# Patient Record
Sex: Male | Born: 1955
Health system: Southern US, Community
[De-identification: ages and names within clinical notes are randomized; demographics above are authoritative.]

## PROBLEM LIST (undated history)

## (undated) DIAGNOSIS — D751 Secondary polycythemia: Secondary | ICD-10-CM

## (undated) DIAGNOSIS — G4733 Obstructive sleep apnea (adult) (pediatric): Secondary | ICD-10-CM

## (undated) DIAGNOSIS — Z87442 Personal history of urinary calculi: Secondary | ICD-10-CM

## (undated) DIAGNOSIS — I639 Cerebral infarction, unspecified: Secondary | ICD-10-CM

## (undated) DIAGNOSIS — F419 Anxiety disorder, unspecified: Secondary | ICD-10-CM

## (undated) DIAGNOSIS — F329 Major depressive disorder, single episode, unspecified: Secondary | ICD-10-CM

## (undated) DIAGNOSIS — E785 Hyperlipidemia, unspecified: Secondary | ICD-10-CM

## (undated) DIAGNOSIS — F32A Depression, unspecified: Secondary | ICD-10-CM

## (undated) DIAGNOSIS — M1990 Unspecified osteoarthritis, unspecified site: Secondary | ICD-10-CM

## (undated) DIAGNOSIS — Z8719 Personal history of other diseases of the digestive system: Secondary | ICD-10-CM

## (undated) HISTORY — PX: TONSILLECTOMY AND ADENOIDECTOMY: SUR1326

## (undated) HISTORY — PX: SPLENECTOMY: SUR1306

## (undated) HISTORY — PX: COLONOSCOPY: SHX174

## (undated) HISTORY — PX: BACK SURGERY: SHX140

## (undated) HISTORY — PX: CERVICAL SPINE SURGERY: SHX589

---
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
## 1999-06-15 ENCOUNTER — Emergency Department (HOSPITAL_COMMUNITY): Admission: EM | Admit: 1999-06-15 | Discharge: 1999-06-15 | Payer: Self-pay | Admitting: Emergency Medicine

## 1999-06-15 ENCOUNTER — Encounter: Payer: Self-pay | Admitting: Emergency Medicine

## 2010-08-05 ENCOUNTER — Encounter: Payer: Self-pay | Admitting: Neurosurgery

## 2015-06-13 ENCOUNTER — Other Ambulatory Visit: Payer: Self-pay | Admitting: Nurse Practitioner

## 2015-06-13 ENCOUNTER — Ambulatory Visit
Admission: RE | Admit: 2015-06-13 | Discharge: 2015-06-13 | Disposition: A | Discharge status: Home / Self Care | Payer: BLUE CROSS/BLUE SHIELD | Source: Ambulatory Visit | Attending: Nurse Practitioner | Admitting: Nurse Practitioner

## 2015-06-13 DIAGNOSIS — J069 Acute upper respiratory infection, unspecified: Secondary | ICD-10-CM

## 2017-07-25 ENCOUNTER — Other Ambulatory Visit: Payer: Self-pay | Admitting: Family Medicine

## 2017-07-25 DIAGNOSIS — R063 Periodic breathing: Secondary | ICD-10-CM

## 2017-08-01 ENCOUNTER — Ambulatory Visit (HOSPITAL_COMMUNITY): Payer: BLUE CROSS/BLUE SHIELD | Attending: Cardiology

## 2017-08-01 ENCOUNTER — Other Ambulatory Visit: Payer: Self-pay

## 2017-08-01 DIAGNOSIS — R063 Periodic breathing: Secondary | ICD-10-CM

## 2017-08-21 DIAGNOSIS — J209 Acute bronchitis, unspecified: Secondary | ICD-10-CM | POA: Diagnosis not present

## 2017-08-29 DIAGNOSIS — R0902 Hypoxemia: Secondary | ICD-10-CM | POA: Diagnosis not present

## 2017-09-02 ENCOUNTER — Other Ambulatory Visit (HOSPITAL_BASED_OUTPATIENT_CLINIC_OR_DEPARTMENT_OTHER): Payer: Self-pay

## 2017-09-02 DIAGNOSIS — R0902 Hypoxemia: Secondary | ICD-10-CM

## 2017-09-02 DIAGNOSIS — G4733 Obstructive sleep apnea (adult) (pediatric): Secondary | ICD-10-CM

## 2017-09-02 DIAGNOSIS — R063 Periodic breathing: Secondary | ICD-10-CM

## 2017-09-10 ENCOUNTER — Encounter (HOSPITAL_BASED_OUTPATIENT_CLINIC_OR_DEPARTMENT_OTHER): Payer: BLUE CROSS/BLUE SHIELD

## 2017-09-11 DIAGNOSIS — G4733 Obstructive sleep apnea (adult) (pediatric): Secondary | ICD-10-CM | POA: Diagnosis not present

## 2017-09-28 ENCOUNTER — Encounter (HOSPITAL_BASED_OUTPATIENT_CLINIC_OR_DEPARTMENT_OTHER): Payer: BLUE CROSS/BLUE SHIELD

## 2017-10-05 ENCOUNTER — Ambulatory Visit (HOSPITAL_BASED_OUTPATIENT_CLINIC_OR_DEPARTMENT_OTHER): Payer: BLUE CROSS/BLUE SHIELD | Attending: Internal Medicine | Admitting: Internal Medicine

## 2017-10-05 VITALS — Ht 70.0 in | Wt 270.0 lb

## 2017-10-05 DIAGNOSIS — R0902 Hypoxemia: Secondary | ICD-10-CM | POA: Insufficient documentation

## 2017-10-05 DIAGNOSIS — G4733 Obstructive sleep apnea (adult) (pediatric): Secondary | ICD-10-CM | POA: Diagnosis not present

## 2017-10-05 DIAGNOSIS — R063 Periodic breathing: Secondary | ICD-10-CM

## 2017-10-11 DIAGNOSIS — G4733 Obstructive sleep apnea (adult) (pediatric): Secondary | ICD-10-CM | POA: Diagnosis not present

## 2017-10-11 NOTE — Procedures (Signed)
   NAME: Paul Hinton DATE OF BIRTH:  08-23-1955 MEDICAL RECORD NUMBER [id number]  LOCATION: [facility]  PHYSICIAN: Deretha EmoryJames C Osborne  DATE OF STUDY: 10/05/2017  SLEEP STUDY TYPE: Positive Airway Pressure Titration               REFERRING PHYSICIAN: Deretha Emorysborne, James C, MD  INDICATION FOR STUDY: Inadequate control of oxygenation no auto BPAP.   EPWORTH SLEEPINESS SCORE:  NA HEIGHT: 5\' 10"  (177.8 cm)  WEIGHT: 270 lb (122.5 kg)    Body mass index is 38.74 kg/m.  NECK SIZE: 20 in.  MEDICATIONS  Patient self administered medications include: N/A. Medications administered during study include No sleep medicine administered.Marland Kitchen.   SLEEP STUDY TECHNIQUE  The patient underwent an attended overnight polysomnography titration to assess the effects of cpap therapy. The following variables were monitored: EEG (C4-A1, C3-A2, O1-A2, O2-A1, F3-M2, F4-M1), EOG, submental and leg EMG, ECG, oxyhemoglobin saturation by pulse oximetry, thoracic and abdominal respiratory effort belts, nasal/oral airflow by pressure sensor, body position sensor and snoring sensor. CPAP pressure was titrated to eliminate apneas, hypopneas and oxygen desaturation.   TECHNICAL COMMENTS  Comments added by Technician: NONE Comments added by Scorer: N/A   SLEEP ARCHITECTURE  The study was initiated at 9:41:29 PM and terminated at 5:28:44 AM. Total recorded time was 467.2 minutes. EEG confirmed total sleep time was 398.5 minutes yielding a sleep efficiency of 85.3%%. Sleep onset after lights out was 10.1 minutes with a REM latency of 82.5 minutes. The patient spent 17.3%% of the night in stage N1 sleep, 55.0%% in stage N2 sleep, 0.0%% in stage N3 and 27.73% in REM. The Arousal Index was 28.9/hour.   RESPIRATORY PARAMETERS  The overall AHI was 25.9 per hour, and the RDI was 27.4 events/hour with a central apnea index of 6.9per hour. The most appropriate setting of BiPAP was IPAP/EPAP 22/19 cm H2O. This pressure was  required to control supine OSA; non-supine OSA was controlled at lower pressures. At this setting, the sleep efficiency was 78 % and the patient was supine for 5%. The AHI was 3.1 events per hour, and the RDI was 3.1 events/hour (with 6.9 central events) and the arousal index was 7.7 per hour.The oxygen nadir was 92.0% during sleep. Mouth opening causing leak was noted despite use of a FFM as his mouth would come below the bottom of the mask.   LEG MOVEMENT DATA  The total leg movements were 0 with a resulting leg movement index of 0.0. Associated arousal with leg movement index was 0.0.   CARDIAC DATA  The underlying cardiac rhythm was most consistent with sinus rhythm. Mean heart rate during sleep was 68.2 bpm. Additional rhythm abnormalities include None.   IMPRESSIONS  - Adequate BIPAP titration. The pressure needed is higher than the pressures he has been using at home. Severe leak had also been noted. Chinstrap may prove useful.   DIAGNOSIS  - Obstructive Sleep Apnea (327.23 [G47.33 ICD-10])  RECOMMENDATIONS  - Auto BPAP with Max IPAP 23, Min EPAP 3, PS of 5  - Chinstrap should be added  Deretha EmoryJames C Osborne Sleep specialist, American Board of Internal Medicine  ELECTRONICALLY SIGNED ON:  10/11/2017, 9:20 PM [facility] PH: [phone]   FX: [phone] ACCREDITED BY THE AMERICAN ACADEMY OF SLEEP MEDICINE

## 2017-10-12 DIAGNOSIS — G4733 Obstructive sleep apnea (adult) (pediatric): Secondary | ICD-10-CM | POA: Diagnosis not present

## 2017-11-11 DIAGNOSIS — G4733 Obstructive sleep apnea (adult) (pediatric): Secondary | ICD-10-CM | POA: Diagnosis not present

## 2017-12-12 DIAGNOSIS — G4733 Obstructive sleep apnea (adult) (pediatric): Secondary | ICD-10-CM | POA: Diagnosis not present

## 2017-12-17 DIAGNOSIS — G4733 Obstructive sleep apnea (adult) (pediatric): Secondary | ICD-10-CM | POA: Diagnosis not present

## 2017-12-17 DIAGNOSIS — R718 Other abnormality of red blood cells: Secondary | ICD-10-CM | POA: Diagnosis not present

## 2018-01-11 DIAGNOSIS — G4733 Obstructive sleep apnea (adult) (pediatric): Secondary | ICD-10-CM | POA: Diagnosis not present

## 2018-01-16 DIAGNOSIS — Z125 Encounter for screening for malignant neoplasm of prostate: Secondary | ICD-10-CM | POA: Diagnosis not present

## 2018-01-16 DIAGNOSIS — E291 Testicular hypofunction: Secondary | ICD-10-CM | POA: Diagnosis not present

## 2018-01-16 DIAGNOSIS — N5201 Erectile dysfunction due to arterial insufficiency: Secondary | ICD-10-CM | POA: Diagnosis not present

## 2018-01-16 DIAGNOSIS — N509 Disorder of male genital organs, unspecified: Secondary | ICD-10-CM | POA: Diagnosis not present

## 2018-02-11 DIAGNOSIS — G4733 Obstructive sleep apnea (adult) (pediatric): Secondary | ICD-10-CM | POA: Diagnosis not present

## 2018-03-14 DIAGNOSIS — G4733 Obstructive sleep apnea (adult) (pediatric): Secondary | ICD-10-CM | POA: Diagnosis not present

## 2018-04-13 DIAGNOSIS — G4733 Obstructive sleep apnea (adult) (pediatric): Secondary | ICD-10-CM | POA: Diagnosis not present

## 2018-05-14 DIAGNOSIS — G4733 Obstructive sleep apnea (adult) (pediatric): Secondary | ICD-10-CM | POA: Diagnosis not present

## 2018-06-13 DIAGNOSIS — G4733 Obstructive sleep apnea (adult) (pediatric): Secondary | ICD-10-CM | POA: Diagnosis not present

## 2018-07-28 DIAGNOSIS — N390 Urinary tract infection, site not specified: Secondary | ICD-10-CM | POA: Diagnosis not present

## 2018-07-28 DIAGNOSIS — B9620 Unspecified Escherichia coli [E. coli] as the cause of diseases classified elsewhere: Secondary | ICD-10-CM | POA: Diagnosis not present

## 2018-08-17 DIAGNOSIS — Z0000 Encounter for general adult medical examination without abnormal findings: Secondary | ICD-10-CM | POA: Diagnosis not present

## 2018-08-17 DIAGNOSIS — R718 Other abnormality of red blood cells: Secondary | ICD-10-CM | POA: Diagnosis not present

## 2018-08-17 DIAGNOSIS — R7301 Impaired fasting glucose: Secondary | ICD-10-CM | POA: Diagnosis not present

## 2018-08-17 DIAGNOSIS — E7800 Pure hypercholesterolemia, unspecified: Secondary | ICD-10-CM | POA: Diagnosis not present

## 2018-08-17 DIAGNOSIS — R063 Periodic breathing: Secondary | ICD-10-CM | POA: Diagnosis not present

## 2018-09-03 ENCOUNTER — Other Ambulatory Visit: Payer: Self-pay | Admitting: Internal Medicine

## 2018-09-03 DIAGNOSIS — M79604 Pain in right leg: Secondary | ICD-10-CM

## 2018-09-17 ENCOUNTER — Ambulatory Visit
Admission: RE | Admit: 2018-09-17 | Discharge: 2018-09-17 | Disposition: A | Discharge status: Home / Self Care | Payer: BLUE CROSS/BLUE SHIELD | Source: Ambulatory Visit | Attending: Internal Medicine | Admitting: Internal Medicine

## 2018-09-17 DIAGNOSIS — M4802 Spinal stenosis, cervical region: Secondary | ICD-10-CM | POA: Diagnosis not present

## 2018-09-17 DIAGNOSIS — M79604 Pain in right leg: Secondary | ICD-10-CM

## 2018-09-17 DIAGNOSIS — M48061 Spinal stenosis, lumbar region without neurogenic claudication: Secondary | ICD-10-CM | POA: Diagnosis not present

## 2018-10-21 DIAGNOSIS — M4802 Spinal stenosis, cervical region: Secondary | ICD-10-CM | POA: Diagnosis not present

## 2018-10-21 DIAGNOSIS — M4712 Other spondylosis with myelopathy, cervical region: Secondary | ICD-10-CM | POA: Diagnosis not present

## 2018-10-23 DIAGNOSIS — M79602 Pain in left arm: Secondary | ICD-10-CM | POA: Diagnosis not present

## 2018-10-23 DIAGNOSIS — M4712 Other spondylosis with myelopathy, cervical region: Secondary | ICD-10-CM | POA: Diagnosis not present

## 2018-10-23 DIAGNOSIS — M4802 Spinal stenosis, cervical region: Secondary | ICD-10-CM | POA: Diagnosis not present

## 2018-10-23 DIAGNOSIS — M79604 Pain in right leg: Secondary | ICD-10-CM | POA: Diagnosis not present

## 2018-10-29 DIAGNOSIS — Z9081 Acquired absence of spleen: Secondary | ICD-10-CM | POA: Diagnosis not present

## 2018-10-29 DIAGNOSIS — Z981 Arthrodesis status: Secondary | ICD-10-CM | POA: Diagnosis not present

## 2018-10-29 DIAGNOSIS — I498 Other specified cardiac arrhythmias: Secondary | ICD-10-CM | POA: Diagnosis not present

## 2018-10-29 DIAGNOSIS — G4733 Obstructive sleep apnea (adult) (pediatric): Secondary | ICD-10-CM | POA: Diagnosis not present

## 2018-10-29 DIAGNOSIS — Z9989 Dependence on other enabling machines and devices: Secondary | ICD-10-CM | POA: Diagnosis not present

## 2018-10-29 DIAGNOSIS — M4712 Other spondylosis with myelopathy, cervical region: Secondary | ICD-10-CM | POA: Diagnosis not present

## 2018-10-29 DIAGNOSIS — M4802 Spinal stenosis, cervical region: Secondary | ICD-10-CM | POA: Diagnosis not present

## 2018-10-29 DIAGNOSIS — M4722 Other spondylosis with radiculopathy, cervical region: Secondary | ICD-10-CM | POA: Diagnosis not present

## 2018-10-30 DIAGNOSIS — Z9989 Dependence on other enabling machines and devices: Secondary | ICD-10-CM | POA: Diagnosis not present

## 2018-10-30 DIAGNOSIS — Z9081 Acquired absence of spleen: Secondary | ICD-10-CM | POA: Diagnosis not present

## 2018-10-30 DIAGNOSIS — M4802 Spinal stenosis, cervical region: Secondary | ICD-10-CM | POA: Diagnosis not present

## 2018-10-30 DIAGNOSIS — G4733 Obstructive sleep apnea (adult) (pediatric): Secondary | ICD-10-CM | POA: Diagnosis not present

## 2018-10-30 DIAGNOSIS — M4712 Other spondylosis with myelopathy, cervical region: Secondary | ICD-10-CM | POA: Diagnosis not present

## 2018-12-14 DIAGNOSIS — N451 Epididymitis: Secondary | ICD-10-CM | POA: Diagnosis not present

## 2018-12-16 DIAGNOSIS — D751 Secondary polycythemia: Secondary | ICD-10-CM | POA: Diagnosis not present

## 2019-02-16 DIAGNOSIS — Z125 Encounter for screening for malignant neoplasm of prostate: Secondary | ICD-10-CM | POA: Diagnosis not present

## 2019-02-16 DIAGNOSIS — E291 Testicular hypofunction: Secondary | ICD-10-CM | POA: Diagnosis not present

## 2019-02-22 DIAGNOSIS — G4733 Obstructive sleep apnea (adult) (pediatric): Secondary | ICD-10-CM | POA: Diagnosis not present

## 2019-02-26 DIAGNOSIS — E291 Testicular hypofunction: Secondary | ICD-10-CM | POA: Diagnosis not present

## 2019-02-26 DIAGNOSIS — N451 Epididymitis: Secondary | ICD-10-CM | POA: Diagnosis not present

## 2019-02-26 DIAGNOSIS — N509 Disorder of male genital organs, unspecified: Secondary | ICD-10-CM | POA: Diagnosis not present

## 2019-02-26 DIAGNOSIS — N5201 Erectile dysfunction due to arterial insufficiency: Secondary | ICD-10-CM | POA: Diagnosis not present

## 2019-03-12 DIAGNOSIS — M542 Cervicalgia: Secondary | ICD-10-CM | POA: Diagnosis not present

## 2019-03-12 DIAGNOSIS — R200 Anesthesia of skin: Secondary | ICD-10-CM | POA: Diagnosis not present

## 2019-03-12 DIAGNOSIS — M79604 Pain in right leg: Secondary | ICD-10-CM | POA: Diagnosis not present

## 2019-03-12 DIAGNOSIS — M79602 Pain in left arm: Secondary | ICD-10-CM | POA: Diagnosis not present

## 2019-04-08 DIAGNOSIS — M4722 Other spondylosis with radiculopathy, cervical region: Secondary | ICD-10-CM | POA: Diagnosis not present

## 2019-04-08 DIAGNOSIS — M5431 Sciatica, right side: Secondary | ICD-10-CM | POA: Diagnosis not present

## 2019-04-08 DIAGNOSIS — M48062 Spinal stenosis, lumbar region with neurogenic claudication: Secondary | ICD-10-CM | POA: Diagnosis not present

## 2019-04-08 DIAGNOSIS — M5136 Other intervertebral disc degeneration, lumbar region: Secondary | ICD-10-CM | POA: Diagnosis not present

## 2019-04-08 DIAGNOSIS — M2578 Osteophyte, vertebrae: Secondary | ICD-10-CM | POA: Diagnosis not present

## 2019-04-08 DIAGNOSIS — R200 Anesthesia of skin: Secondary | ICD-10-CM | POA: Diagnosis not present

## 2019-04-08 DIAGNOSIS — M898X8 Other specified disorders of bone, other site: Secondary | ICD-10-CM | POA: Diagnosis not present

## 2019-04-08 DIAGNOSIS — Z981 Arthrodesis status: Secondary | ICD-10-CM | POA: Diagnosis not present

## 2019-04-08 DIAGNOSIS — M47812 Spondylosis without myelopathy or radiculopathy, cervical region: Secondary | ICD-10-CM | POA: Diagnosis not present

## 2019-04-08 DIAGNOSIS — M4316 Spondylolisthesis, lumbar region: Secondary | ICD-10-CM | POA: Diagnosis not present

## 2019-04-08 DIAGNOSIS — M4712 Other spondylosis with myelopathy, cervical region: Secondary | ICD-10-CM | POA: Diagnosis not present

## 2019-04-08 DIAGNOSIS — M5031 Other cervical disc degeneration,  high cervical region: Secondary | ICD-10-CM | POA: Diagnosis not present

## 2019-04-08 DIAGNOSIS — M4802 Spinal stenosis, cervical region: Secondary | ICD-10-CM | POA: Diagnosis not present

## 2019-04-08 DIAGNOSIS — Z09 Encounter for follow-up examination after completed treatment for conditions other than malignant neoplasm: Secondary | ICD-10-CM | POA: Diagnosis not present

## 2019-04-09 DIAGNOSIS — E291 Testicular hypofunction: Secondary | ICD-10-CM | POA: Diagnosis not present

## 2019-04-13 DIAGNOSIS — M5416 Radiculopathy, lumbar region: Secondary | ICD-10-CM | POA: Diagnosis not present

## 2019-04-13 DIAGNOSIS — M5412 Radiculopathy, cervical region: Secondary | ICD-10-CM | POA: Diagnosis not present

## 2019-04-13 DIAGNOSIS — F320 Major depressive disorder, single episode, mild: Secondary | ICD-10-CM | POA: Diagnosis not present

## 2019-04-19 DIAGNOSIS — M5416 Radiculopathy, lumbar region: Secondary | ICD-10-CM | POA: Diagnosis not present

## 2019-04-19 DIAGNOSIS — M48062 Spinal stenosis, lumbar region with neurogenic claudication: Secondary | ICD-10-CM | POA: Diagnosis not present

## 2019-05-04 DIAGNOSIS — M5416 Radiculopathy, lumbar region: Secondary | ICD-10-CM | POA: Diagnosis not present

## 2019-05-19 DIAGNOSIS — M48062 Spinal stenosis, lumbar region with neurogenic claudication: Secondary | ICD-10-CM | POA: Diagnosis not present

## 2019-05-19 DIAGNOSIS — M5416 Radiculopathy, lumbar region: Secondary | ICD-10-CM | POA: Diagnosis not present

## 2019-05-19 DIAGNOSIS — M5090 Cervical disc disorder, unspecified, unspecified cervical region: Secondary | ICD-10-CM | POA: Diagnosis not present

## 2019-05-20 DIAGNOSIS — M5030 Other cervical disc degeneration, unspecified cervical region: Secondary | ICD-10-CM | POA: Diagnosis not present

## 2019-05-20 DIAGNOSIS — M4726 Other spondylosis with radiculopathy, lumbar region: Secondary | ICD-10-CM | POA: Diagnosis not present

## 2019-06-01 DIAGNOSIS — M545 Low back pain: Secondary | ICD-10-CM | POA: Diagnosis not present

## 2019-06-01 DIAGNOSIS — M47816 Spondylosis without myelopathy or radiculopathy, lumbar region: Secondary | ICD-10-CM | POA: Diagnosis not present

## 2019-06-23 DIAGNOSIS — N5201 Erectile dysfunction due to arterial insufficiency: Secondary | ICD-10-CM | POA: Diagnosis not present

## 2019-06-23 DIAGNOSIS — N201 Calculus of ureter: Secondary | ICD-10-CM | POA: Diagnosis not present

## 2019-06-23 DIAGNOSIS — E291 Testicular hypofunction: Secondary | ICD-10-CM | POA: Diagnosis not present

## 2019-06-24 DIAGNOSIS — M48062 Spinal stenosis, lumbar region with neurogenic claudication: Secondary | ICD-10-CM | POA: Diagnosis not present

## 2019-06-24 DIAGNOSIS — M5090 Cervical disc disorder, unspecified, unspecified cervical region: Secondary | ICD-10-CM | POA: Diagnosis not present

## 2019-06-24 DIAGNOSIS — M5416 Radiculopathy, lumbar region: Secondary | ICD-10-CM | POA: Diagnosis not present

## 2019-06-24 DIAGNOSIS — R200 Anesthesia of skin: Secondary | ICD-10-CM | POA: Diagnosis not present

## 2019-06-24 DIAGNOSIS — R202 Paresthesia of skin: Secondary | ICD-10-CM | POA: Diagnosis not present

## 2019-07-01 DIAGNOSIS — M48062 Spinal stenosis, lumbar region with neurogenic claudication: Secondary | ICD-10-CM | POA: Diagnosis not present

## 2019-07-01 DIAGNOSIS — M5090 Cervical disc disorder, unspecified, unspecified cervical region: Secondary | ICD-10-CM | POA: Diagnosis not present

## 2019-07-01 DIAGNOSIS — R200 Anesthesia of skin: Secondary | ICD-10-CM | POA: Diagnosis not present

## 2019-07-01 DIAGNOSIS — M5416 Radiculopathy, lumbar region: Secondary | ICD-10-CM | POA: Diagnosis not present

## 2019-07-01 DIAGNOSIS — R202 Paresthesia of skin: Secondary | ICD-10-CM | POA: Diagnosis not present

## 2019-07-05 DIAGNOSIS — M48062 Spinal stenosis, lumbar region with neurogenic claudication: Secondary | ICD-10-CM | POA: Diagnosis not present

## 2019-07-05 DIAGNOSIS — R202 Paresthesia of skin: Secondary | ICD-10-CM | POA: Diagnosis not present

## 2019-07-05 DIAGNOSIS — M5416 Radiculopathy, lumbar region: Secondary | ICD-10-CM | POA: Diagnosis not present

## 2019-07-05 DIAGNOSIS — M5090 Cervical disc disorder, unspecified, unspecified cervical region: Secondary | ICD-10-CM | POA: Diagnosis not present

## 2019-07-05 DIAGNOSIS — R200 Anesthesia of skin: Secondary | ICD-10-CM | POA: Diagnosis not present

## 2019-07-13 DIAGNOSIS — R200 Anesthesia of skin: Secondary | ICD-10-CM | POA: Diagnosis not present

## 2019-07-13 DIAGNOSIS — M5416 Radiculopathy, lumbar region: Secondary | ICD-10-CM | POA: Diagnosis not present

## 2019-07-13 DIAGNOSIS — R202 Paresthesia of skin: Secondary | ICD-10-CM | POA: Diagnosis not present

## 2019-07-13 DIAGNOSIS — M5090 Cervical disc disorder, unspecified, unspecified cervical region: Secondary | ICD-10-CM | POA: Diagnosis not present

## 2019-07-13 DIAGNOSIS — M48062 Spinal stenosis, lumbar region with neurogenic claudication: Secondary | ICD-10-CM | POA: Diagnosis not present

## 2019-07-21 IMAGING — MR MR LUMBAR SPINE W/O CM
4 of 5 series · 24 of 48 positions shown · non-contrast
Comparison: CT abdomen and pelvis July 02, 2015

CLINICAL DATA: Low back pain, burning sensation RIGHT leg for 5
months. Remote history of lumbar spine surgery.

EXAM:
MRI LUMBAR SPINE WITHOUT CONTRAST
TECHNIQUE: Multiplanar, multisequence MR imaging of the lumbar spine was
performed. No intravenous contrast was administered.

[Series 3: T2 post-contrast · sagittal · 4.0mm · 0.55mm/px · 7 of 15 slices shown]
[im 1/15]
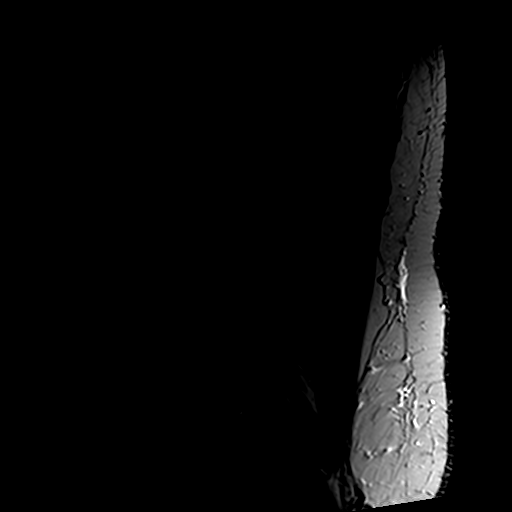
[im 3/15]
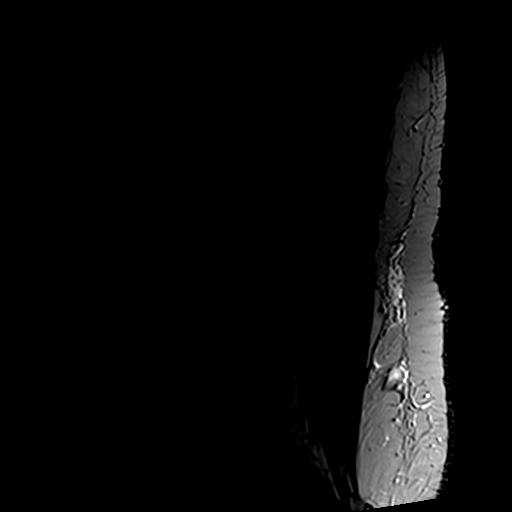
[im 5/15]
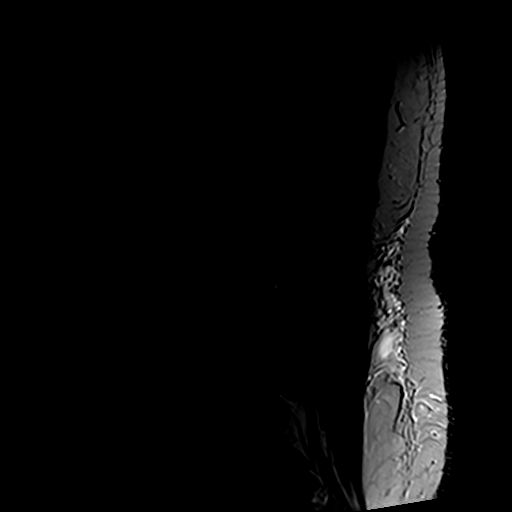
[im 8/15]
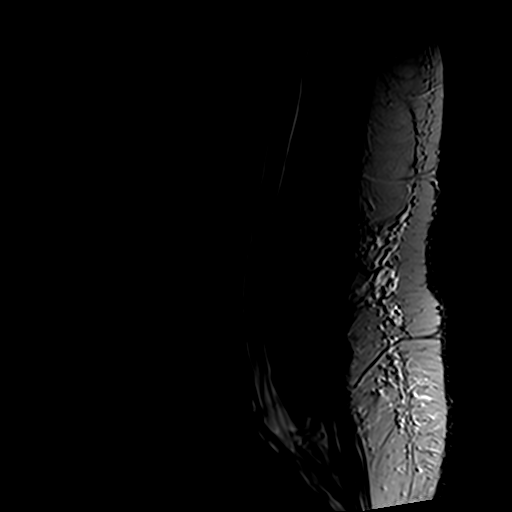
[im 10/15]
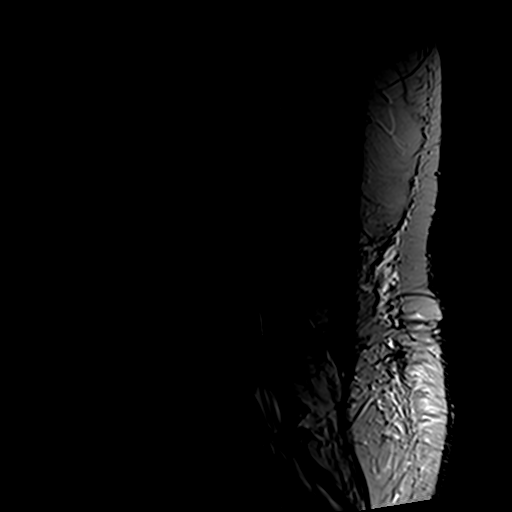
[im 12/15]
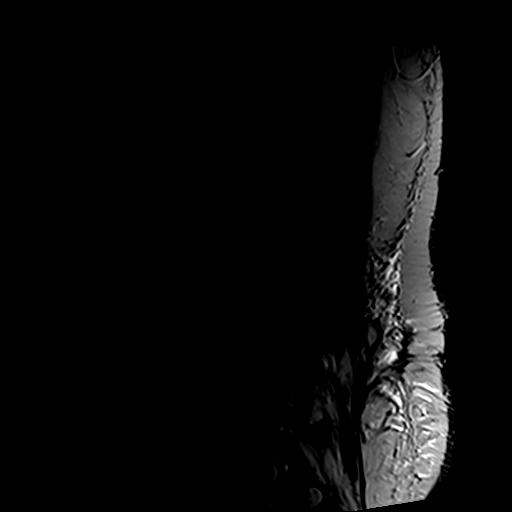
[im 15/15]
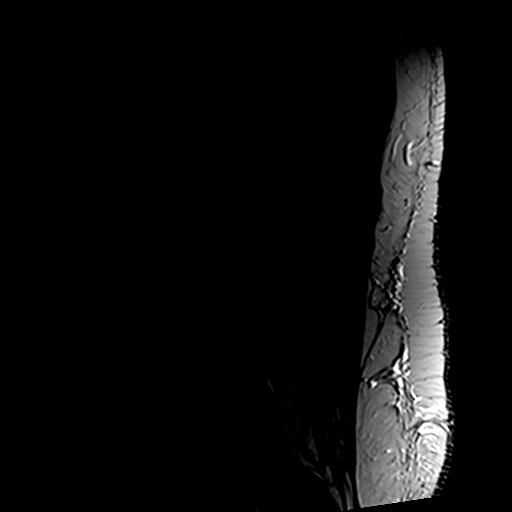

[Series 5: T1 · sagittal · 4.0mm · 0.55mm/px · 6 of 15 slices shown (1 of 2)]
[im 1/15]
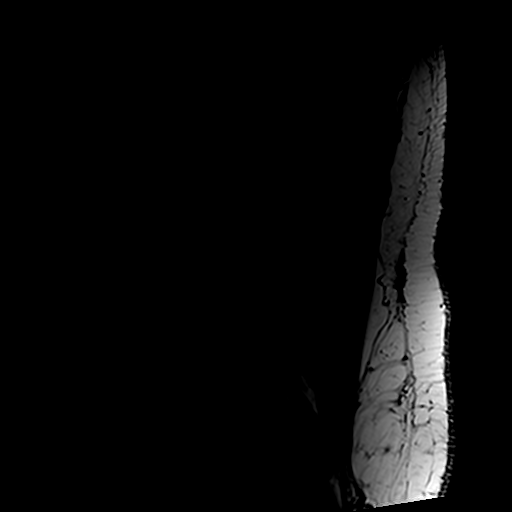
[im 3/15]
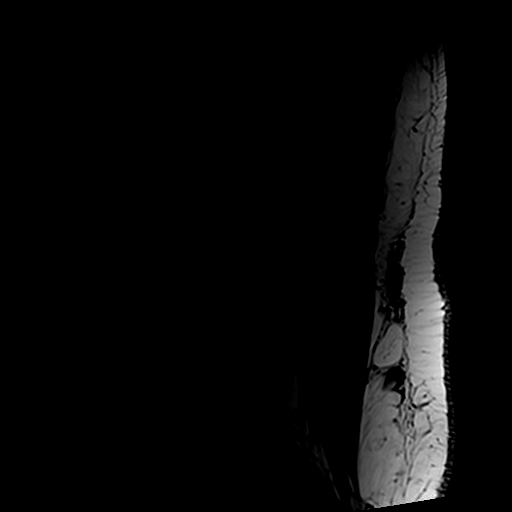
[im 6/15]
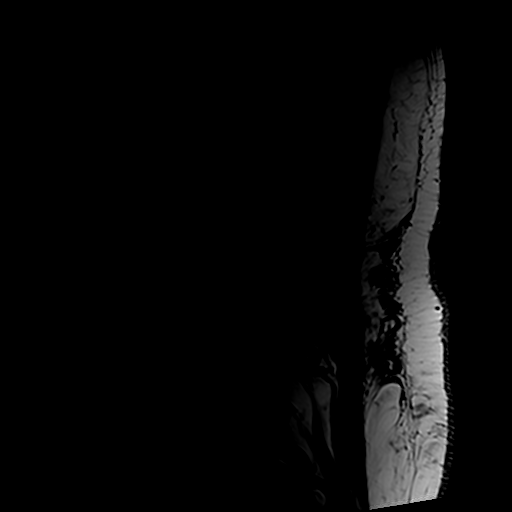
[im 9/15]
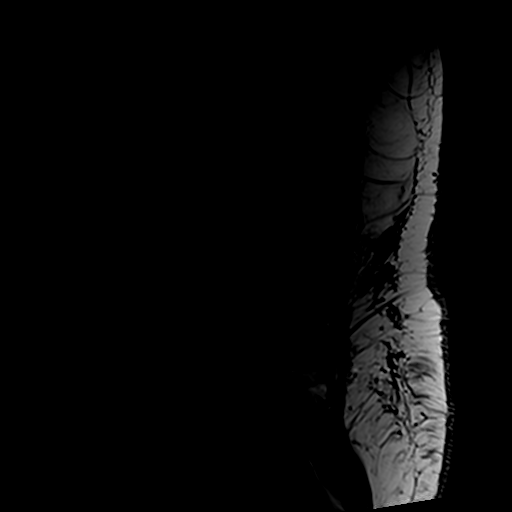
[im 12/15]
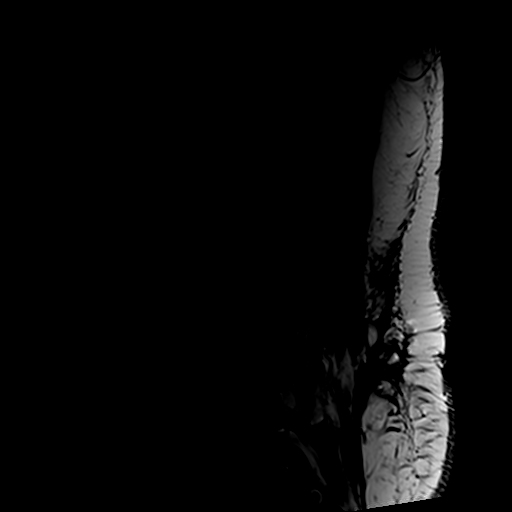
[im 15/15]
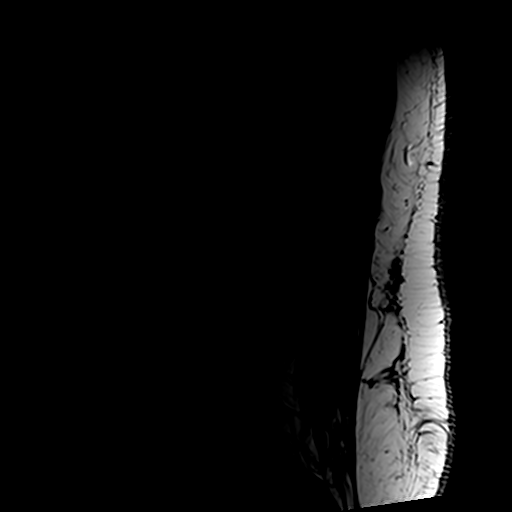

[Series 6: T1 · axial · 4.0mm · 0.35mm/px · z∈[-65,+84]mm · 3 of 33 slices shown (2 of 2)]
[im 5/33]
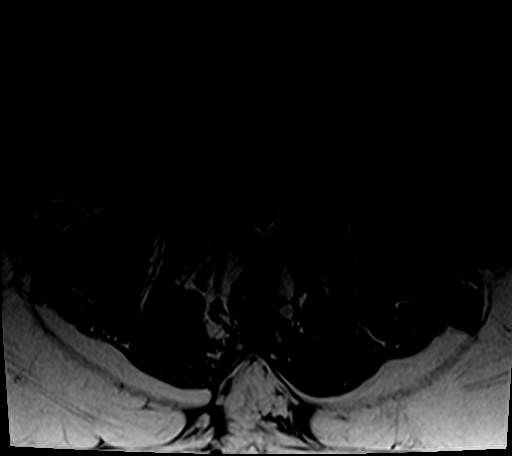
[im 18/33]
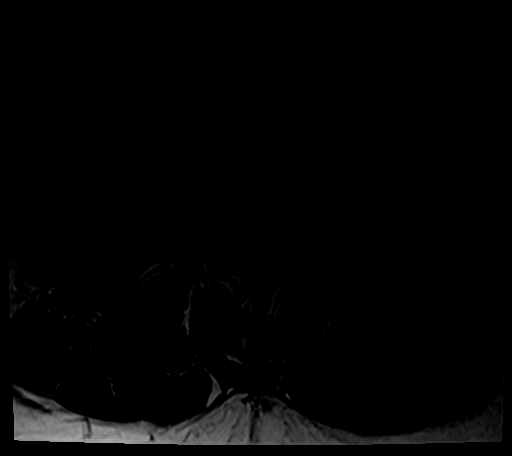
[im 28/33]
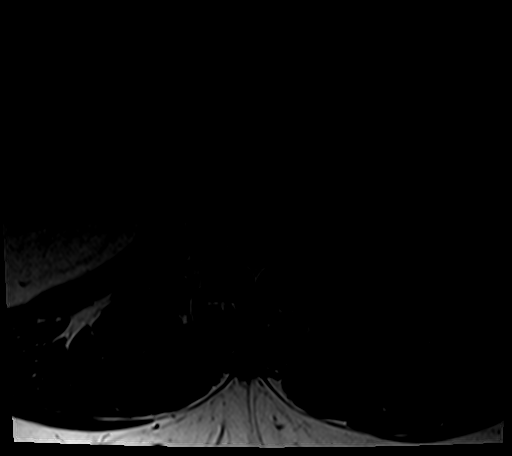

[Series 7: T2 · axial · 4.0mm · 0.70mm/px · z∈[-85,+123]mm · 8 of 33 slices shown]
[im 1/33]
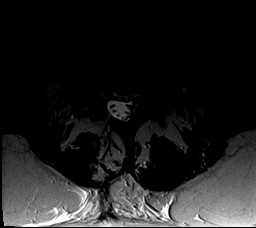
[im 5/33]
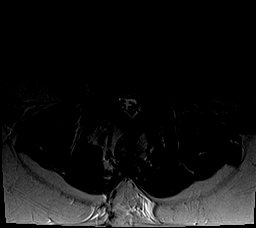
[im 10/33]
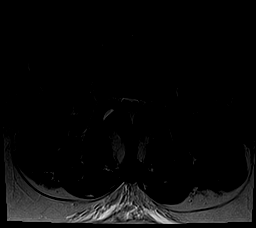
[im 15/33]
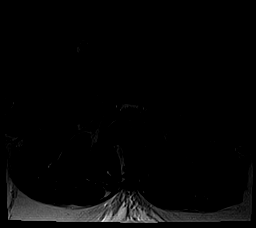
[im 18/33]
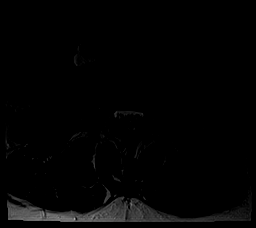
[im 23/33]
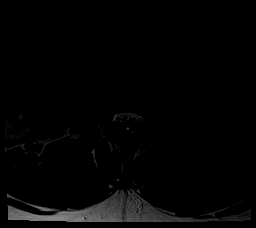
[im 28/33]
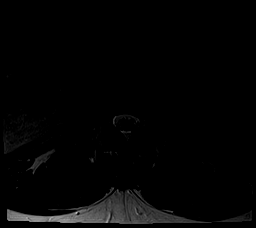
[im 33/33]
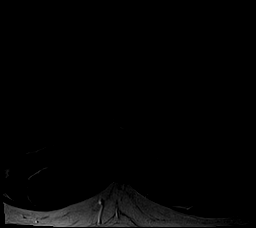

[24 of 48 positions shown; findings below may reference images not displayed]

FINDINGS: SEGMENTATION: For the purposes of this report, the last well-formed
intervertebral disc is reported as L5-S1.

ALIGNMENT: Maintained lumbar lordosis. No malalignment.

VERTEBRAE:Vertebral bodies are intact. Severe L4-5 and L5-S1 disc
height loss with severe chronic discogenic endplate changes toward
the RIGHT L4-5. Bridging bone marrow signal at L5-S1 most compatible
arthrodesis. Remaining disc morphology is maintained. No acute or
abnormal bone marrow signal. Congenital canal narrowing on the basis
of foreshortened pedicles.

CONUS MEDULLARIS AND CAUDA EQUINA: Conus medullaris terminates at
L1-2 and demonstrates normal morphology and signal characteristics.
Cauda equina is normal.

PARASPINAL AND OTHER SOFT TISSUES: Nonacute. Small cysts along the
chylous duct.

DISC LEVELS:

T11-12: Small broad-based disc bulge, endplate spurring with mild
facet arthropathy. No canal stenosis. Mild bilateral neural
foraminal narrowing.

T12-L1 through L2-3: No disc bulge, canal stenosis nor neural
foraminal narrowing. Mild facet arthropathy.

L3-4: Small broad-based LEFT subarticular to extraforaminal disc
protrusion encroaches upon the exited LEFT L3 nerve. Moderate facet
arthropathy and ligamentum flavum redundancy. Trace reactive
effusions. No canal stenosis. LEFT neural foraminal narrowing.

L4-5: Status post RIGHT hemilaminectomy. Small broad-based disc
bulge, endplate spurring with moderate facet arthropathy. Mild canal
stenosis. Moderate to severe RIGHT and moderate LEFT neural
foraminal narrowing.

L5-S1: No axial imaging. Suspected RIGHT hemilaminectomy. Small
broad-based disc bulge and endplate spurring without canal stenosis.
Moderate RIGHT and mild LEFT neural foraminal narrowing.
IMPRESSION: 1. RIGHT L4-5 and likely L5-S1 hemilaminectomies. No acute osseous
process.
2. Mild canal stenosis L4-5.
3. Neural foraminal narrowing T11-12, L3-4 through L5-S1: Moderate
to severe on the RIGHT at L4-5.

## 2019-07-23 DIAGNOSIS — R1032 Left lower quadrant pain: Secondary | ICD-10-CM | POA: Diagnosis not present

## 2019-07-23 DIAGNOSIS — N201 Calculus of ureter: Secondary | ICD-10-CM | POA: Diagnosis not present

## 2019-08-02 DIAGNOSIS — N200 Calculus of kidney: Secondary | ICD-10-CM | POA: Diagnosis not present

## 2019-08-02 DIAGNOSIS — N201 Calculus of ureter: Secondary | ICD-10-CM | POA: Diagnosis not present

## 2019-08-18 DIAGNOSIS — N202 Calculus of kidney with calculus of ureter: Secondary | ICD-10-CM | POA: Diagnosis not present

## 2019-08-18 DIAGNOSIS — R350 Frequency of micturition: Secondary | ICD-10-CM | POA: Diagnosis not present

## 2019-08-20 DIAGNOSIS — R7301 Impaired fasting glucose: Secondary | ICD-10-CM | POA: Diagnosis not present

## 2019-08-20 DIAGNOSIS — E7800 Pure hypercholesterolemia, unspecified: Secondary | ICD-10-CM | POA: Diagnosis not present

## 2019-08-20 DIAGNOSIS — R718 Other abnormality of red blood cells: Secondary | ICD-10-CM | POA: Diagnosis not present

## 2019-08-20 DIAGNOSIS — Z6841 Body Mass Index (BMI) 40.0 and over, adult: Secondary | ICD-10-CM | POA: Diagnosis not present

## 2019-08-20 DIAGNOSIS — Z0000 Encounter for general adult medical examination without abnormal findings: Secondary | ICD-10-CM | POA: Diagnosis not present

## 2019-08-31 DIAGNOSIS — M5412 Radiculopathy, cervical region: Secondary | ICD-10-CM | POA: Diagnosis not present

## 2019-08-31 DIAGNOSIS — M48062 Spinal stenosis, lumbar region with neurogenic claudication: Secondary | ICD-10-CM | POA: Diagnosis not present

## 2019-08-31 DIAGNOSIS — M5416 Radiculopathy, lumbar region: Secondary | ICD-10-CM | POA: Diagnosis not present

## 2019-09-07 DIAGNOSIS — M5412 Radiculopathy, cervical region: Secondary | ICD-10-CM | POA: Diagnosis not present

## 2019-09-13 DIAGNOSIS — R8271 Bacteriuria: Secondary | ICD-10-CM | POA: Diagnosis not present

## 2019-09-13 DIAGNOSIS — N202 Calculus of kidney with calculus of ureter: Secondary | ICD-10-CM | POA: Diagnosis not present

## 2019-09-16 ENCOUNTER — Other Ambulatory Visit: Payer: Self-pay | Admitting: Urology

## 2019-09-17 ENCOUNTER — Encounter (HOSPITAL_COMMUNITY): Payer: Self-pay

## 2019-09-17 NOTE — Patient Instructions (Addendum)
DUE TO COVID-19 ONLY ONE VISITOR IS ALLOWED TO COME WITH YOU AND STAY IN THE WAITING ROOM ONLY DURING PRE OP AND PROCEDURE. THE ONE VISITOR MAY VISIT WITH YOU IN YOUR PRIVATE ROOM DURING VISITING HOURS ONLY!!   COVID SWAB TESTING MUST BE COMPLETED ON:  Thursday, September 23, 2019 at  2:55PM  [address], [place] [facility] enter pre surgical testing line (Must self quarantine after testing. Follow instructions on handout.)             Your procedure is scheduled on: Monday, September 27, 2019   Report to [facility] Main  Entrance   Report to Short Stay at 5:30 AM    Call this number if you have problems the morning of surgery [phone]   Bring BIPAP mask and tubing day of surgery   Do not eat food or drink liquids :After Midnight.   Oral Hygiene is also important to reduce your risk of infection.                                    Remember - BRUSH YOUR TEETH THE MORNING OF SURGERY WITH YOUR REGULAR TOOTHPASTE   Do NOT smoke after Midnight   Take these medicines the morning of surgery with A SIP OF WATER: Duloxetine, Fexofenadine                               You may not have any metal on your body including jewelry, and body piercings             Do not wear lotions, powders, perfumes/cologne, or deodorant                           Men may shave face and neck.   Do not bring valuables to the hospital. [facility] IS NOT             RESPONSIBLE   FOR VALUABLES.   Contacts, dentures or bridgework may not be worn into surgery.    Patients discharged the day of surgery will not be allowed to drive home.   Special Instructions: Bring a copy of your healthcare power of attorney and living will documents         the day of surgery if you haven't scanned them in before.              Please read over the following fact sheets you were given:  [facility] - Preparing for Surgery Before surgery, you can play an important role.  Because skin is not  sterile, your skin needs to be as free of germs as possible.  You can reduce the number of germs on your skin by washing with CHG (chlorahexidine gluconate) soap before surgery.  CHG is an antiseptic cleaner which kills germs and bonds with the skin to continue killing germs even after washing. Please DO NOT use if you have an allergy to CHG or antibacterial soaps.  If your skin becomes reddened/irritated stop using the CHG and inform your nurse when you arrive at Short Stay. Do not shave (including legs and underarms) for at least 48 hours prior to the first CHG shower.  You may shave your face/neck.  Please follow these instructions carefully:  1.  Shower with CHG Soap the night  before surgery and the  morning of surgery.  2.  If you choose to wash your hair, wash your hair first as usual with your normal  shampoo.  3.  After you shampoo, rinse your hair and body thoroughly to remove the shampoo.                             4.  Use CHG as you would any other liquid soap.  You can apply chg directly to the skin and wash.  Gently with a scrungie or clean washcloth.  5.  Apply the CHG Soap to your body ONLY FROM THE NECK DOWN.   Do   not use on face/ open                           Wound or open sores. Avoid contact with eyes, ears mouth and   genitals (private parts).                       Wash face,  Genitals (private parts) with your normal soap.             6.  Wash thoroughly, paying special attention to the area where your    surgery  will be performed.  7.  Thoroughly rinse your body with warm water from the neck down.  8.  DO NOT shower/wash with your normal soap after using and rinsing off the CHG Soap.                9.  Pat yourself dry with a clean towel.            10.  Wear clean pajamas.            11.  Place clean sheets on your bed the night of your first shower and do not  sleep with pets. Day of Surgery : Do not apply any lotions/deodorants the morning of surgery.  Please wear  clean clothes to the hospital/surgery center.  FAILURE TO FOLLOW THESE INSTRUCTIONS MAY RESULT IN THE CANCELLATION OF YOUR SURGERY  PATIENT SIGNATURE_________________________________  NURSE SIGNATURE__________________________________  ________________________________________________________________________

## 2019-09-20 ENCOUNTER — Encounter (HOSPITAL_COMMUNITY)
Admission: RE | Admit: 2019-09-20 | Discharge: 2019-09-20 | Disposition: A | Discharge status: Home / Self Care | Payer: BC Managed Care – PPO | Source: Ambulatory Visit | Attending: Urology | Admitting: Urology

## 2019-09-20 ENCOUNTER — Other Ambulatory Visit: Payer: Self-pay

## 2019-09-20 ENCOUNTER — Encounter (HOSPITAL_COMMUNITY): Payer: Self-pay

## 2019-09-20 DIAGNOSIS — Z01812 Encounter for preprocedural laboratory examination: Secondary | ICD-10-CM | POA: Diagnosis not present

## 2019-09-20 HISTORY — DX: Depression, unspecified: F32.A

## 2019-09-20 HISTORY — DX: Hyperlipidemia, unspecified: E78.5

## 2019-09-20 HISTORY — DX: Unspecified osteoarthritis, unspecified site: M19.90

## 2019-09-20 HISTORY — DX: Obstructive sleep apnea (adult) (pediatric): G47.33

## 2019-09-20 HISTORY — DX: Personal history of urinary calculi: Z87.442

## 2019-09-20 HISTORY — DX: Anxiety disorder, unspecified: F41.9

## 2019-09-20 LAB — CBC
HCT: 45.2 % (ref 39.0–52.0)
Hemoglobin: 14.4 g/dL (ref 13.0–17.0)
MCH: 31.6 pg (ref 26.0–34.0)
MCHC: 31.9 g/dL (ref 30.0–36.0)
MCV: 99.1 fL (ref 80.0–100.0)
Platelets: 316 10*3/uL (ref 150–400)
RBC: 4.56 MIL/uL (ref 4.22–5.81)
RDW: 15.4 % (ref 11.5–15.5)
WBC: 8.9 10*3/uL (ref 4.0–10.5)
nRBC: 0 % (ref 0.0–0.2)

## 2019-09-20 NOTE — Progress Notes (Signed)
PCP - Dr. Roseanne Reno Cardiologist - N/A  Chest x-ray - N/A EKG - 10/29/18 in epic Stress Test - N/A ECHO - greater than 2 years Cardiac Cath - N/A  Sleep Study - [facility] BiPAP - Yes  Fasting Blood Sugar - N/A Checks Blood Sugar __N/A___ times a day  Blood Thinner Instructions: N/A Aspirin Instructions: N/A Last Dose: N/A  Anesthesia review: N/A   Patient denies shortness of breath, fever, cough and chest pain at PAT appointment   Patient verbalized understanding of instructions that were given to them at the PAT appointment. Patient was also instructed that they will need to review over the PAT instructions again at home before surgery.

## 2019-09-23 ENCOUNTER — Other Ambulatory Visit (HOSPITAL_COMMUNITY)
Admission: RE | Admit: 2019-09-23 | Discharge: 2019-09-23 | Disposition: A | Discharge status: Home / Self Care | Payer: BC Managed Care – PPO | Source: Ambulatory Visit | Attending: Urology | Admitting: Urology

## 2019-09-23 DIAGNOSIS — Z01812 Encounter for preprocedural laboratory examination: Secondary | ICD-10-CM | POA: Diagnosis not present

## 2019-09-23 DIAGNOSIS — Z20822 Contact with and (suspected) exposure to covid-19: Secondary | ICD-10-CM | POA: Diagnosis not present

## 2019-09-23 LAB — SARS CORONAVIRUS 2 (TAT 6-24 HRS): SARS Coronavirus 2: NEGATIVE

## 2019-09-25 NOTE — Anesthesia Preprocedure Evaluation (Addendum)
Anesthesia Evaluation  Patient identified by MRN, date of birth, ID band Patient awake    Reviewed: Allergy & Precautions, NPO status , Patient's Chart, lab work & pertinent test results  Airway Mallampati: II  TM Distance: >3 FB     Dental   Pulmonary former smoker,    breath sounds clear to auscultation       Cardiovascular negative cardio ROS   Rhythm:Regular Rate:Normal     Neuro/Psych    GI/Hepatic Neg liver ROS,   Endo/Other  negative endocrine ROS  Renal/GU negative Renal ROS     Musculoskeletal  (+) Arthritis ,   Abdominal   Peds  Hematology   Anesthesia Other Findings   Reproductive/Obstetrics                            Anesthesia Physical Anesthesia Plan  ASA: II  Anesthesia Plan: General   Post-op Pain Management:    Induction: Intravenous  PONV Risk Score and Plan: 2 and Ondansetron, Midazolam and Dexamethasone  Airway Management Planned: LMA and Oral ETT  Additional Equipment:   Intra-op Plan:   Post-operative Plan: Extubation in OR  Informed Consent: I have reviewed the patients History and Physical, chart, labs and discussed the procedure including the risks, benefits and alternatives for the proposed anesthesia with the patient or authorized representative who has indicated his/her understanding and acceptance.     Dental advisory given  Plan Discussed with: Anesthesiologist and CRNA  Anesthesia Plan Comments:        Anesthesia Quick Evaluation
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
## 2019-09-27 ENCOUNTER — Encounter (HOSPITAL_COMMUNITY)
Admission: RE | Disposition: A | Discharge status: Home / Self Care | Payer: Self-pay | Source: Home / Self Care | Attending: Urology

## 2019-09-27 ENCOUNTER — Ambulatory Visit (HOSPITAL_COMMUNITY): Payer: BC Managed Care – PPO | Admitting: Physician Assistant

## 2019-09-27 ENCOUNTER — Ambulatory Visit (HOSPITAL_COMMUNITY): Payer: BC Managed Care – PPO | Admitting: Certified Registered"

## 2019-09-27 ENCOUNTER — Ambulatory Visit (HOSPITAL_COMMUNITY): Payer: BC Managed Care – PPO

## 2019-09-27 ENCOUNTER — Encounter (HOSPITAL_COMMUNITY): Payer: Self-pay | Admitting: Urology

## 2019-09-27 ENCOUNTER — Ambulatory Visit (HOSPITAL_COMMUNITY)
Admission: RE | Admit: 2019-09-27 | Discharge: 2019-09-27 | Disposition: A | Discharge status: Home / Self Care | Payer: BC Managed Care – PPO | Attending: Urology | Admitting: Urology

## 2019-09-27 DIAGNOSIS — M1990 Unspecified osteoarthritis, unspecified site: Secondary | ICD-10-CM | POA: Diagnosis not present

## 2019-09-27 DIAGNOSIS — R350 Frequency of micturition: Secondary | ICD-10-CM | POA: Diagnosis not present

## 2019-09-27 DIAGNOSIS — F329 Major depressive disorder, single episode, unspecified: Secondary | ICD-10-CM | POA: Insufficient documentation

## 2019-09-27 DIAGNOSIS — F419 Anxiety disorder, unspecified: Secondary | ICD-10-CM | POA: Diagnosis not present

## 2019-09-27 DIAGNOSIS — N210 Calculus in bladder: Secondary | ICD-10-CM | POA: Diagnosis not present

## 2019-09-27 DIAGNOSIS — E785 Hyperlipidemia, unspecified: Secondary | ICD-10-CM | POA: Insufficient documentation

## 2019-09-27 DIAGNOSIS — N202 Calculus of kidney with calculus of ureter: Secondary | ICD-10-CM | POA: Diagnosis not present

## 2019-09-27 DIAGNOSIS — R3915 Urgency of urination: Secondary | ICD-10-CM | POA: Insufficient documentation

## 2019-09-27 DIAGNOSIS — G4733 Obstructive sleep apnea (adult) (pediatric): Secondary | ICD-10-CM | POA: Insufficient documentation

## 2019-09-27 DIAGNOSIS — Z87891 Personal history of nicotine dependence: Secondary | ICD-10-CM | POA: Diagnosis not present

## 2019-09-27 DIAGNOSIS — Z87442 Personal history of urinary calculi: Secondary | ICD-10-CM | POA: Insufficient documentation

## 2019-09-27 DIAGNOSIS — Z880 Allergy status to penicillin: Secondary | ICD-10-CM | POA: Insufficient documentation

## 2019-09-27 DIAGNOSIS — F418 Other specified anxiety disorders: Secondary | ICD-10-CM | POA: Diagnosis not present

## 2019-09-27 DIAGNOSIS — Z885 Allergy status to narcotic agent status: Secondary | ICD-10-CM | POA: Insufficient documentation

## 2019-09-27 HISTORY — PX: CYSTOSCOPY WITH RETROGRADE PYELOGRAM, URETEROSCOPY AND STENT PLACEMENT: SHX5789

## 2019-09-27 SURGERY — CYSTOURETEROSCOPY, WITH RETROGRADE PYELOGRAM AND STENT INSERTION
Anesthesia: General | Laterality: Right

## 2019-09-27 MED ORDER — LIDOCAINE 2% (20 MG/ML) 5 ML SYRINGE
INTRAMUSCULAR | Status: DC | PRN
  Administered 2019-09-27: 40 mg via INTRAVENOUS

## 2019-09-27 MED ORDER — LACTATED RINGERS IV SOLN: INTRAVENOUS | Status: DC

## 2019-09-27 MED ORDER — ONDANSETRON HCL 4 MG/2ML IJ SOLN
INTRAMUSCULAR | Status: DC | PRN
  Administered 2019-09-27: 4 mg via INTRAVENOUS

## 2019-09-27 MED ORDER — LIDOCAINE 2% (20 MG/ML) 5 ML SYRINGE
INTRAMUSCULAR | Status: AC
  Filled 2019-09-27: qty 5

## 2019-09-27 MED ORDER — DEXAMETHASONE SODIUM PHOSPHATE 10 MG/ML IJ SOLN
INTRAMUSCULAR | Status: DC | PRN
  Administered 2019-09-27: 4 mg via INTRAVENOUS

## 2019-09-27 MED ORDER — PHENYLEPHRINE HCL (PRESSORS) 10 MG/ML IV SOLN
INTRAVENOUS | Status: AC
  Filled 2019-09-27: qty 1

## 2019-09-27 MED ORDER — ONDANSETRON HCL 4 MG/2ML IJ SOLN
INTRAMUSCULAR | Status: AC
  Filled 2019-09-27: qty 2

## 2019-09-27 MED ORDER — PROPOFOL 10 MG/ML IV BOLUS
INTRAVENOUS | Status: AC
  Filled 2019-09-27: qty 40

## 2019-09-27 MED ORDER — IOHEXOL 300 MG/ML  SOLN
INTRAMUSCULAR | Status: DC | PRN
  Administered 2019-09-27: 10 mL

## 2019-09-27 MED ORDER — FENTANYL CITRATE (PF) 100 MCG/2ML IJ SOLN
INTRAMUSCULAR | Status: DC | PRN
  Administered 2019-09-27 (×2): 50 ug via INTRAVENOUS

## 2019-09-27 MED ORDER — MIDAZOLAM HCL 2 MG/2ML IJ SOLN
INTRAMUSCULAR | Status: AC
  Filled 2019-09-27: qty 2

## 2019-09-27 MED ORDER — PROPOFOL 10 MG/ML IV BOLUS
INTRAVENOUS | Status: DC | PRN
  Administered 2019-09-27: 200 mg via INTRAVENOUS

## 2019-09-27 MED ORDER — MIDAZOLAM HCL 2 MG/2ML IJ SOLN
INTRAMUSCULAR | Status: DC | PRN
  Administered 2019-09-27: 1 mg via INTRAVENOUS

## 2019-09-27 MED ORDER — SODIUM CHLORIDE 0.9 % IR SOLN
Status: DC | PRN
  Administered 2019-09-27: 3000 mL

## 2019-09-27 MED ORDER — FENTANYL CITRATE (PF) 100 MCG/2ML IJ SOLN
INTRAMUSCULAR | Status: AC
  Filled 2019-09-27: qty 2

## 2019-09-27 MED ORDER — DEXAMETHASONE SODIUM PHOSPHATE 10 MG/ML IJ SOLN
INTRAMUSCULAR | Status: AC
  Filled 2019-09-27: qty 1

## 2019-09-27 MED ORDER — CIPROFLOXACIN IN D5W 400 MG/200ML IV SOLN
400.0000 mg | INTRAVENOUS | Status: AC
  Administered 2019-09-27: 400 mg via INTRAVENOUS
  Filled 2019-09-27: qty 200

## 2019-09-27 MED ORDER — FENTANYL CITRATE (PF) 100 MCG/2ML IJ SOLN: 25.0000 ug | INTRAMUSCULAR | Status: DC | PRN

## 2019-09-27 SURGICAL SUPPLY — 27 items
BAG URO CATCHER STRL LF (MISCELLANEOUS) ×4 IMPLANT
BASKET LASER NITINOL 1.9FR (BASKET) IMPLANT
BASKET ZERO TIP NITINOL 2.4FR (BASKET) IMPLANT
CATH INTERMIT  6FR 70CM (CATHETERS) ×4 IMPLANT
COVER SURGICAL LIGHT HANDLE (MISCELLANEOUS) ×4 IMPLANT
COVER WAND RF STERILE (DRAPES) IMPLANT
FIBER LASER FLEXIVA 1000 (UROLOGICAL SUPPLIES) IMPLANT
FIBER LASER FLEXIVA 365 (UROLOGICAL SUPPLIES) IMPLANT
FIBER LASER FLEXIVA 550 (UROLOGICAL SUPPLIES) IMPLANT
FIBER LASER TRAC TIP (UROLOGICAL SUPPLIES) IMPLANT
GLOVE BIOGEL M 8.0 STRL (GLOVE) ×4 IMPLANT
GOWN STRL REUS W/TWL XL LVL3 (GOWN DISPOSABLE) ×4 IMPLANT
GUIDEWIRE ANG ZIPWIRE 038X150 (WIRE) ×4 IMPLANT
GUIDEWIRE STR DUAL SENSOR (WIRE) ×4 IMPLANT
KIT PROBE 340X3.4XDISP GRN (MISCELLANEOUS) IMPLANT
KIT PROBE TRILOGY 3.4X340 (MISCELLANEOUS)
KIT PROBE TRILOGY 3.9X350 (MISCELLANEOUS) IMPLANT
KIT TURNOVER KIT A (KITS) IMPLANT
MANIFOLD NEPTUNE II (INSTRUMENTS) ×4 IMPLANT
PACK CYSTO (CUSTOM PROCEDURE TRAY) ×4 IMPLANT
PENCIL SMOKE EVACUATOR (MISCELLANEOUS) IMPLANT
SYR TOOMEY IRRIG 70ML (MISCELLANEOUS)
SYRINGE TOOMEY IRRIG 70ML (MISCELLANEOUS) IMPLANT
TUBING CONNECTING 10 (TUBING) ×3 IMPLANT
TUBING CONNECTING 10' (TUBING) ×1
TUBING STONE CATCHER TRILOGY (MISCELLANEOUS) IMPLANT
TUBING UROLOGY SET (TUBING) ×4 IMPLANT

## 2019-09-27 NOTE — Anesthesia Procedure Notes (Signed)
Procedure Name: LMA Insertion Date/Time: 09/27/2019 7:50 AM Performed by: Sindy Guadeloupe, CRNA Pre-anesthesia Checklist: Patient identified, Emergency Drugs available, Suction available, Patient being monitored and Timeout performed Patient Re-evaluated:Patient Re-evaluated prior to induction Oxygen Delivery Method: Circle system utilized Preoxygenation: Pre-oxygenation with 100% oxygen Induction Type: IV induction Ventilation: Mask ventilation without difficulty LMA: LMA inserted and LMA with gastric port inserted LMA Size: 5.0 Number of attempts: 1 Tube secured with: Tape Dental Injury: Teeth and Oropharynx as per pre-operative assessment

## 2019-09-27 NOTE — Discharge Instructions (Signed)
1. You may see some blood in the urine and may have some burning with urination for 48-72 hours. You also may notice that you have to urinate more frequently or urgently after your procedure which is normal.  °2. You should call should you develop an inability urinate, fever > 101, persistent nausea and vomiting that prevents you from eating or drinking to stay hydrated.  °

## 2019-09-27 NOTE — Op Note (Signed)
Preoperative diagnosis: Right distal ureteral stone, left flank pain  Postoperative diagnosis: Stone present at bladder neck, no evidence of ureteral calculi  Principal procedure: Cystoscopy, extraction of 10 mm bladder stone at bladder neck, bilateral retrograde ureteral pyelograms, fluoroscopic interpretation  Surgeon: Lawarence Meek  Anesthesia: General with LMA  Complications: None  Specimen: Stone  Drains: None  Estimated blood loss: None  Indications: 64 year old male with history of urolithiasis.  He has been having symptoms from a right distal ureteral stone for some time.  The stone has not passed despite medical expulsive therapy.  He is still symptomatic with flank pain, even on the left side, as well as urinary frequency and urgency.  At this point, with recent imaging showing that he still has a stone at his right ureterovesical junction, he has requested management.  I discussed ureteroscopic stone extraction with him.  Additionally, he has been having recent left flank pain and I have offered to treat a left-sided stone of present.  The patient understands the procedure as well as risks and complications and has decided to proceed.  Findings: Urethra was normal.  There was a stone at the bladder neck which was impacted with some inflammation of the urothelium in this area.  The stone was pushed back up into the bladder.  Full inspection of the bladder was performed which revealed normal urothelium.  Ureteral orifice ease were normal in configuration location.  Retrograde studies of both ureters was performed using Omnipaque.  This revealed narrow ureters bilaterally without the evidence of ureteral stones or obstruction.  Description of procedure: The patient was properly identified and marked in the holding area.  He was taken to the operating room where general anesthetic was administered with the LMA.  Is placed in the dorsolithotomy position.  Genitalia and perineum were prepped  and draped.  Proper timeout was performed.  21 French panendoscope was advanced under direct vision using the Foroblique lens.  The above-mentioned findings were seen.  The stone was pushed into the bladder.  The stone was extracted by placing the scope up against the stone, the telescope was removed and the stone was rinsed out through the scope.  No further stone material was seen at this point.  Retrograde studies of the ureters were performed bilaterally using a 6 [place] open-ended catheter and Omnipaque.  There was no evidence of ureteral stones bilaterally.  At this point, there being no ureteral stones to treat, I then removed the scope following drainage of the bladder.  The procedure was then terminated.  The patient was awakened and taken to the PACU in stable condition, having tolerated the procedure well.

## 2019-09-27 NOTE — Interval H&P Note (Signed)
History and Physical Interval Note:  09/27/2019 7:33 AM  Paul Hinton  has presented today for surgery, with the diagnosis of BLADDER STONE VERSUS RIGHT URETERAL CALCULUS.  The various methods of treatment have been discussed with the patient and family. After consideration of risks, benefits and other options for treatment, the patient has consented to  Procedure(s) with comments: CYSTOSCOPY WITH RETROGRADE PYELOGRAM, URETEROSCOPY AND STENT PLACEMENT (Right) - 1 HR POSSIBLE CYSTOSCOPY WITH LITHOLAPAXY (N/A) HOLMIUM LASER APPLICATION (Right) as a surgical intervention.  The patient's history has been reviewed, patient examined, no change in status, stable for surgery.  I have reviewed the patient's chart and labs.  Questions were answered to the patient's satisfaction.     Bertram Millard Briauna Gilmartin

## 2019-09-27 NOTE — Anesthesia Postprocedure Evaluation (Signed)
Anesthesia Post Note  Patient: Paul Hinton  Procedure(s) Performed: CYSTOSCOPY WITH BILATERAL URETERAL RETROGRADE PYELOGRAM (Right )     Patient location during evaluation: PACU Anesthesia Type: General Level of consciousness: awake Pain management: pain level controlled Vital Signs Assessment: post-procedure vital signs reviewed and stable Respiratory status: spontaneous breathing Cardiovascular status: stable Postop Assessment: no apparent nausea or vomiting Anesthetic complications: no    Last Vitals:  Vitals:   09/27/19 0830 09/27/19 0847  BP: (!) 174/91 131/83  Pulse: 77 74  Resp: 17   Temp:    SpO2: 100% 98%    Last Pain:  Vitals:   09/27/19 0819  TempSrc:   PainSc: 0-No pain                 Elease Swarm

## 2019-09-27 NOTE — Transfer of Care (Signed)
Immediate Anesthesia Transfer of Care Note  Patient: Paul Hinton  Procedure(s) Performed: CYSTOSCOPY WITH RETROGRADE PYELOGRAM, URETEROSCOPY (Right )  Patient Location: PACU  Anesthesia Type:General  Level of Consciousness: awake, alert  and oriented  Airway & Oxygen Therapy: Patient Spontanous Breathing and Patient connected to face mask oxygen  Post-op Assessment: Report given to RN and Post -op Vital signs reviewed and stable  Post vital signs: Reviewed and stable  Last Vitals:  Vitals Value Taken Time  BP 174/92 09/27/19 0819  Temp    Pulse 81 09/27/19 0820  Resp 16 09/27/19 0820  SpO2 98 % 09/27/19 0820  Vitals shown include unvalidated device data.  Last Pain:  Vitals:   09/27/19 0628  TempSrc:   PainSc: 0-No pain         Complications: No apparent anesthesia complications

## 2019-10-05 DIAGNOSIS — M5412 Radiculopathy, cervical region: Secondary | ICD-10-CM | POA: Diagnosis not present

## 2019-10-05 DIAGNOSIS — M5416 Radiculopathy, lumbar region: Secondary | ICD-10-CM | POA: Diagnosis not present

## 2019-10-06 DIAGNOSIS — N202 Calculus of kidney with calculus of ureter: Secondary | ICD-10-CM | POA: Diagnosis not present

## 2019-10-06 DIAGNOSIS — E291 Testicular hypofunction: Secondary | ICD-10-CM | POA: Diagnosis not present

## 2019-10-06 DIAGNOSIS — N210 Calculus in bladder: Secondary | ICD-10-CM | POA: Diagnosis not present

## 2019-11-05 DIAGNOSIS — C4441 Basal cell carcinoma of skin of scalp and neck: Secondary | ICD-10-CM | POA: Diagnosis not present

## 2019-11-05 DIAGNOSIS — L820 Inflamed seborrheic keratosis: Secondary | ICD-10-CM | POA: Diagnosis not present

## 2019-11-05 DIAGNOSIS — D225 Melanocytic nevi of trunk: Secondary | ICD-10-CM | POA: Diagnosis not present

## 2019-11-05 DIAGNOSIS — D485 Neoplasm of uncertain behavior of skin: Secondary | ICD-10-CM | POA: Diagnosis not present

## 2019-11-05 DIAGNOSIS — L821 Other seborrheic keratosis: Secondary | ICD-10-CM | POA: Diagnosis not present

## 2019-11-25 DIAGNOSIS — M5412 Radiculopathy, cervical region: Secondary | ICD-10-CM | POA: Diagnosis not present

## 2019-12-07 DIAGNOSIS — Z08 Encounter for follow-up examination after completed treatment for malignant neoplasm: Secondary | ICD-10-CM | POA: Diagnosis not present

## 2019-12-07 DIAGNOSIS — Z85828 Personal history of other malignant neoplasm of skin: Secondary | ICD-10-CM | POA: Diagnosis not present

## 2019-12-10 DIAGNOSIS — R8271 Bacteriuria: Secondary | ICD-10-CM | POA: Diagnosis not present

## 2019-12-10 DIAGNOSIS — N202 Calculus of kidney with calculus of ureter: Secondary | ICD-10-CM | POA: Diagnosis not present

## 2019-12-17 DIAGNOSIS — R1084 Generalized abdominal pain: Secondary | ICD-10-CM | POA: Diagnosis not present

## 2019-12-17 DIAGNOSIS — R8271 Bacteriuria: Secondary | ICD-10-CM | POA: Diagnosis not present

## 2019-12-17 DIAGNOSIS — N132 Hydronephrosis with renal and ureteral calculous obstruction: Secondary | ICD-10-CM | POA: Diagnosis not present

## 2019-12-17 DIAGNOSIS — N202 Calculus of kidney with calculus of ureter: Secondary | ICD-10-CM | POA: Diagnosis not present

## 2019-12-23 DIAGNOSIS — N202 Calculus of kidney with calculus of ureter: Secondary | ICD-10-CM | POA: Diagnosis not present

## 2020-02-22 DIAGNOSIS — M5416 Radiculopathy, lumbar region: Secondary | ICD-10-CM | POA: Diagnosis not present

## 2020-02-22 DIAGNOSIS — M5412 Radiculopathy, cervical region: Secondary | ICD-10-CM | POA: Diagnosis not present

## 2020-02-22 DIAGNOSIS — F325 Major depressive disorder, single episode, in full remission: Secondary | ICD-10-CM | POA: Diagnosis not present

## 2020-02-29 DIAGNOSIS — Z125 Encounter for screening for malignant neoplasm of prostate: Secondary | ICD-10-CM | POA: Diagnosis not present

## 2020-02-29 DIAGNOSIS — E291 Testicular hypofunction: Secondary | ICD-10-CM | POA: Diagnosis not present

## 2020-03-10 DIAGNOSIS — N201 Calculus of ureter: Secondary | ICD-10-CM | POA: Diagnosis not present

## 2020-03-10 DIAGNOSIS — N5201 Erectile dysfunction due to arterial insufficiency: Secondary | ICD-10-CM | POA: Diagnosis not present

## 2020-03-10 DIAGNOSIS — E291 Testicular hypofunction: Secondary | ICD-10-CM | POA: Diagnosis not present

## 2020-03-13 DIAGNOSIS — G4733 Obstructive sleep apnea (adult) (pediatric): Secondary | ICD-10-CM | POA: Diagnosis not present

## 2020-08-22 DIAGNOSIS — N529 Male erectile dysfunction, unspecified: Secondary | ICD-10-CM | POA: Diagnosis not present

## 2020-08-22 DIAGNOSIS — Z0000 Encounter for general adult medical examination without abnormal findings: Secondary | ICD-10-CM | POA: Diagnosis not present

## 2020-08-22 DIAGNOSIS — E291 Testicular hypofunction: Secondary | ICD-10-CM | POA: Diagnosis not present

## 2020-08-22 DIAGNOSIS — D7589 Other specified diseases of blood and blood-forming organs: Secondary | ICD-10-CM | POA: Diagnosis not present

## 2020-08-23 ENCOUNTER — Other Ambulatory Visit: Payer: Self-pay | Admitting: Internal Medicine

## 2020-08-23 DIAGNOSIS — Z136 Encounter for screening for cardiovascular disorders: Secondary | ICD-10-CM

## 2020-08-23 DIAGNOSIS — E291 Testicular hypofunction: Secondary | ICD-10-CM | POA: Diagnosis not present

## 2020-08-23 DIAGNOSIS — R948 Abnormal results of function studies of other organs and systems: Secondary | ICD-10-CM | POA: Diagnosis not present

## 2020-08-30 DIAGNOSIS — N5201 Erectile dysfunction due to arterial insufficiency: Secondary | ICD-10-CM | POA: Diagnosis not present

## 2020-08-30 DIAGNOSIS — E291 Testicular hypofunction: Secondary | ICD-10-CM | POA: Diagnosis not present

## 2020-09-12 ENCOUNTER — Ambulatory Visit
Admission: RE | Admit: 2020-09-12 | Discharge: 2020-09-12 | Disposition: A | Discharge status: Home / Self Care | Payer: BC Managed Care – PPO | Source: Ambulatory Visit | Attending: Internal Medicine | Admitting: Internal Medicine

## 2020-09-12 DIAGNOSIS — Z136 Encounter for screening for cardiovascular disorders: Secondary | ICD-10-CM | POA: Diagnosis not present

## 2020-10-03 DIAGNOSIS — G4733 Obstructive sleep apnea (adult) (pediatric): Secondary | ICD-10-CM | POA: Diagnosis not present

## 2020-11-23 DIAGNOSIS — M5416 Radiculopathy, lumbar region: Secondary | ICD-10-CM | POA: Diagnosis not present

## 2020-11-23 DIAGNOSIS — M5432 Sciatica, left side: Secondary | ICD-10-CM | POA: Diagnosis not present

## 2020-11-30 DIAGNOSIS — R310 Gross hematuria: Secondary | ICD-10-CM | POA: Diagnosis not present

## 2020-11-30 DIAGNOSIS — R3915 Urgency of urination: Secondary | ICD-10-CM | POA: Diagnosis not present

## 2020-11-30 DIAGNOSIS — R300 Dysuria: Secondary | ICD-10-CM | POA: Diagnosis not present

## 2020-12-12 DIAGNOSIS — M5416 Radiculopathy, lumbar region: Secondary | ICD-10-CM | POA: Diagnosis not present

## 2020-12-14 DIAGNOSIS — R310 Gross hematuria: Secondary | ICD-10-CM | POA: Diagnosis not present

## 2021-01-08 DIAGNOSIS — G4733 Obstructive sleep apnea (adult) (pediatric): Secondary | ICD-10-CM | POA: Diagnosis not present

## 2021-02-20 DIAGNOSIS — R948 Abnormal results of function studies of other organs and systems: Secondary | ICD-10-CM | POA: Diagnosis not present

## 2021-02-20 DIAGNOSIS — E291 Testicular hypofunction: Secondary | ICD-10-CM | POA: Diagnosis not present

## 2021-02-26 DIAGNOSIS — R3915 Urgency of urination: Secondary | ICD-10-CM | POA: Diagnosis not present

## 2021-02-26 DIAGNOSIS — R350 Frequency of micturition: Secondary | ICD-10-CM | POA: Diagnosis not present

## 2021-02-26 DIAGNOSIS — E291 Testicular hypofunction: Secondary | ICD-10-CM | POA: Diagnosis not present

## 2021-02-26 DIAGNOSIS — N5201 Erectile dysfunction due to arterial insufficiency: Secondary | ICD-10-CM | POA: Diagnosis not present

## 2021-03-08 DIAGNOSIS — H524 Presbyopia: Secondary | ICD-10-CM | POA: Diagnosis not present

## 2021-03-08 DIAGNOSIS — H5213 Myopia, bilateral: Secondary | ICD-10-CM | POA: Diagnosis not present

## 2021-03-08 DIAGNOSIS — H2513 Age-related nuclear cataract, bilateral: Secondary | ICD-10-CM | POA: Diagnosis not present

## 2021-03-08 DIAGNOSIS — H52223 Regular astigmatism, bilateral: Secondary | ICD-10-CM | POA: Diagnosis not present

## 2021-03-28 DIAGNOSIS — G4733 Obstructive sleep apnea (adult) (pediatric): Secondary | ICD-10-CM | POA: Diagnosis not present

## 2021-04-10 DIAGNOSIS — N200 Calculus of kidney: Secondary | ICD-10-CM | POA: Diagnosis not present

## 2021-04-25 DIAGNOSIS — R338 Other retention of urine: Secondary | ICD-10-CM | POA: Diagnosis not present

## 2021-06-12 ENCOUNTER — Ambulatory Visit
Admission: RE | Admit: 2021-06-12 | Discharge: 2021-06-12 | Disposition: A | Discharge status: Home / Self Care | Payer: Medicare Other | Source: Ambulatory Visit | Attending: Internal Medicine | Admitting: Internal Medicine

## 2021-06-12 ENCOUNTER — Other Ambulatory Visit: Payer: Self-pay | Admitting: Internal Medicine

## 2021-06-12 DIAGNOSIS — L729 Follicular cyst of the skin and subcutaneous tissue, unspecified: Secondary | ICD-10-CM | POA: Diagnosis not present

## 2021-06-12 DIAGNOSIS — R52 Pain, unspecified: Secondary | ICD-10-CM

## 2021-06-12 DIAGNOSIS — M25562 Pain in left knee: Secondary | ICD-10-CM | POA: Diagnosis not present

## 2021-06-12 DIAGNOSIS — N200 Calculus of kidney: Secondary | ICD-10-CM | POA: Diagnosis not present

## 2021-06-12 DIAGNOSIS — R600 Localized edema: Secondary | ICD-10-CM | POA: Diagnosis not present

## 2021-06-12 DIAGNOSIS — M79642 Pain in left hand: Secondary | ICD-10-CM | POA: Diagnosis not present

## 2021-06-21 DIAGNOSIS — M25562 Pain in left knee: Secondary | ICD-10-CM | POA: Diagnosis not present

## 2021-07-31 DIAGNOSIS — M25511 Pain in right shoulder: Secondary | ICD-10-CM | POA: Diagnosis not present

## 2021-07-31 DIAGNOSIS — M25562 Pain in left knee: Secondary | ICD-10-CM | POA: Diagnosis not present

## 2021-07-31 DIAGNOSIS — M67911 Unspecified disorder of synovium and tendon, right shoulder: Secondary | ICD-10-CM | POA: Diagnosis not present

## 2021-07-31 DIAGNOSIS — M542 Cervicalgia: Secondary | ICD-10-CM | POA: Diagnosis not present

## 2021-08-21 DIAGNOSIS — M25511 Pain in right shoulder: Secondary | ICD-10-CM | POA: Diagnosis not present

## 2021-08-21 DIAGNOSIS — M1712 Unilateral primary osteoarthritis, left knee: Secondary | ICD-10-CM | POA: Diagnosis not present

## 2021-08-21 DIAGNOSIS — M25562 Pain in left knee: Secondary | ICD-10-CM | POA: Diagnosis not present

## 2021-08-21 DIAGNOSIS — M67911 Unspecified disorder of synovium and tendon, right shoulder: Secondary | ICD-10-CM | POA: Diagnosis not present

## 2021-08-24 DIAGNOSIS — E291 Testicular hypofunction: Secondary | ICD-10-CM | POA: Diagnosis not present

## 2021-08-24 DIAGNOSIS — Z0000 Encounter for general adult medical examination without abnormal findings: Secondary | ICD-10-CM | POA: Diagnosis not present

## 2021-08-24 DIAGNOSIS — E1169 Type 2 diabetes mellitus with other specified complication: Secondary | ICD-10-CM | POA: Diagnosis not present

## 2021-08-24 DIAGNOSIS — Z1389 Encounter for screening for other disorder: Secondary | ICD-10-CM | POA: Diagnosis not present

## 2021-08-24 DIAGNOSIS — E7800 Pure hypercholesterolemia, unspecified: Secondary | ICD-10-CM | POA: Diagnosis not present

## 2021-08-24 DIAGNOSIS — G4733 Obstructive sleep apnea (adult) (pediatric): Secondary | ICD-10-CM | POA: Diagnosis not present

## 2021-11-08 DIAGNOSIS — M1712 Unilateral primary osteoarthritis, left knee: Secondary | ICD-10-CM | POA: Diagnosis not present

## 2021-11-26 DIAGNOSIS — R059 Cough, unspecified: Secondary | ICD-10-CM | POA: Diagnosis not present

## 2021-11-26 DIAGNOSIS — J069 Acute upper respiratory infection, unspecified: Secondary | ICD-10-CM | POA: Diagnosis not present

## 2021-11-26 DIAGNOSIS — J4521 Mild intermittent asthma with (acute) exacerbation: Secondary | ICD-10-CM | POA: Diagnosis not present

## 2021-11-30 DIAGNOSIS — E785 Hyperlipidemia, unspecified: Secondary | ICD-10-CM | POA: Diagnosis not present

## 2021-11-30 DIAGNOSIS — R202 Paresthesia of skin: Secondary | ICD-10-CM | POA: Diagnosis not present

## 2021-11-30 DIAGNOSIS — R2990 Unspecified symptoms and signs involving the nervous system: Secondary | ICD-10-CM | POA: Diagnosis not present

## 2021-11-30 DIAGNOSIS — F17210 Nicotine dependence, cigarettes, uncomplicated: Secondary | ICD-10-CM | POA: Diagnosis not present

## 2021-11-30 DIAGNOSIS — I639 Cerebral infarction, unspecified: Secondary | ICD-10-CM | POA: Diagnosis not present

## 2021-11-30 DIAGNOSIS — Z6837 Body mass index (BMI) 37.0-37.9, adult: Secondary | ICD-10-CM | POA: Diagnosis not present

## 2021-11-30 DIAGNOSIS — R531 Weakness: Secondary | ICD-10-CM | POA: Diagnosis not present

## 2021-11-30 DIAGNOSIS — G4733 Obstructive sleep apnea (adult) (pediatric): Secondary | ICD-10-CM | POA: Diagnosis not present

## 2021-11-30 DIAGNOSIS — R29810 Facial weakness: Secondary | ICD-10-CM | POA: Diagnosis not present

## 2021-11-30 DIAGNOSIS — E669 Obesity, unspecified: Secondary | ICD-10-CM | POA: Diagnosis not present

## 2021-11-30 DIAGNOSIS — Z79899 Other long term (current) drug therapy: Secondary | ICD-10-CM | POA: Diagnosis not present

## 2021-11-30 DIAGNOSIS — I6782 Cerebral ischemia: Secondary | ICD-10-CM | POA: Diagnosis not present

## 2021-11-30 DIAGNOSIS — D751 Secondary polycythemia: Secondary | ICD-10-CM | POA: Diagnosis not present

## 2021-12-01 DIAGNOSIS — R2990 Unspecified symptoms and signs involving the nervous system: Secondary | ICD-10-CM | POA: Diagnosis not present

## 2021-12-01 DIAGNOSIS — I639 Cerebral infarction, unspecified: Secondary | ICD-10-CM | POA: Diagnosis not present

## 2021-12-01 DIAGNOSIS — E669 Obesity, unspecified: Secondary | ICD-10-CM | POA: Diagnosis not present

## 2021-12-01 DIAGNOSIS — G4733 Obstructive sleep apnea (adult) (pediatric): Secondary | ICD-10-CM | POA: Diagnosis not present

## 2021-12-05 DIAGNOSIS — Z8673 Personal history of transient ischemic attack (TIA), and cerebral infarction without residual deficits: Secondary | ICD-10-CM | POA: Diagnosis not present

## 2021-12-05 DIAGNOSIS — R531 Weakness: Secondary | ICD-10-CM | POA: Diagnosis not present

## 2021-12-05 DIAGNOSIS — E291 Testicular hypofunction: Secondary | ICD-10-CM | POA: Diagnosis not present

## 2021-12-05 DIAGNOSIS — Q2112 Patent foramen ovale: Secondary | ICD-10-CM | POA: Diagnosis not present

## 2021-12-07 DIAGNOSIS — R531 Weakness: Secondary | ICD-10-CM | POA: Diagnosis not present

## 2021-12-11 ENCOUNTER — Encounter: Payer: Self-pay | Admitting: Physician Assistant

## 2021-12-11 ENCOUNTER — Ambulatory Visit: Payer: Medicare Other | Admitting: Physician Assistant

## 2021-12-11 VITALS — BP 149/80 | HR 62 | Resp 18 | Ht 70.0 in | Wt 270.0 lb

## 2021-12-11 DIAGNOSIS — I69392 Facial weakness following cerebral infarction: Secondary | ICD-10-CM | POA: Diagnosis not present

## 2021-12-11 DIAGNOSIS — R202 Paresthesia of skin: Secondary | ICD-10-CM

## 2021-12-11 MED ORDER — CLOPIDOGREL BISULFATE 75 MG PO TABS: 75.0000 mg | ORAL_TABLET | Freq: Every day | ORAL | 1 refills | Status: DC

## 2021-12-11 NOTE — Patient Instructions (Signed)
Follow up in 3 months  Plavix 75 mg daily for 2 weeks Follow up with cardiology on the PFO
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
## 2021-12-12 DIAGNOSIS — R531 Weakness: Secondary | ICD-10-CM | POA: Diagnosis not present

## 2021-12-19 DIAGNOSIS — R531 Weakness: Secondary | ICD-10-CM | POA: Diagnosis not present

## 2021-12-20 ENCOUNTER — Encounter: Payer: Self-pay | Admitting: Cardiovascular Disease

## 2021-12-20 ENCOUNTER — Other Ambulatory Visit: Payer: Self-pay | Admitting: Cardiovascular Disease

## 2021-12-20 ENCOUNTER — Ambulatory Visit (INDEPENDENT_AMBULATORY_CARE_PROVIDER_SITE_OTHER): Payer: Medicare Other

## 2021-12-20 ENCOUNTER — Ambulatory Visit (INDEPENDENT_AMBULATORY_CARE_PROVIDER_SITE_OTHER): Payer: Medicare Other | Admitting: Cardiovascular Disease

## 2021-12-20 VITALS — BP 140/86 | HR 75 | Ht 70.0 in | Wt 270.6 lb

## 2021-12-20 DIAGNOSIS — G4733 Obstructive sleep apnea (adult) (pediatric): Secondary | ICD-10-CM | POA: Diagnosis not present

## 2021-12-20 DIAGNOSIS — E7800 Pure hypercholesterolemia, unspecified: Secondary | ICD-10-CM

## 2021-12-20 DIAGNOSIS — I639 Cerebral infarction, unspecified: Secondary | ICD-10-CM

## 2021-12-20 DIAGNOSIS — Q2112 Patent foramen ovale: Secondary | ICD-10-CM

## 2021-12-20 DIAGNOSIS — I4891 Unspecified atrial fibrillation: Secondary | ICD-10-CM

## 2021-12-20 DIAGNOSIS — Z9081 Acquired absence of spleen: Secondary | ICD-10-CM

## 2021-12-20 NOTE — Progress Notes (Unsigned)
Enrolled for [employer] to mail a ZIO XT long term holter monitor to the patients address on file.  

## 2021-12-20 NOTE — Patient Instructions (Signed)
Medication Instructions:  No changes *If you need a refill on your cardiac medications before your next appointment, please call your pharmacy*  Follow-Up: At [facility], you and your health needs are our priority.  As part of our continuing mission to provide you with exceptional heart care, we have created designated Provider Care Teams.  These Care Teams include your primary Cardiologist (physician) and Advanced Practice Providers (APPs -  Physician Assistants and Nurse Practitioners) who all work together to provide you with the care you need, when you need it.  We recommend signing up for the patient portal called "MyChart".  Sign up information is provided on this After Visit Summary.  MyChart is used to connect with patients for Virtual Visits (Telemedicine).  Patients are able to view lab/test results, encounter notes, upcoming appointments, etc.  Non-urgent messages can be sent to your provider as well.   To learn more about what you can do with MyChart, go to [email or web address].    Your next appointment:   Follow up with Dr. Royann Shivers after the TEE on 01/21/22  Other Instructions  You are scheduled for a TEE on 01/21/22 with Dr. Flora Lipps.  Please arrive at the [facility] (Main Entrance A) at [facility]: [address] [place], [place] [address] at 8 am. (1 hour prior to procedure)  DIET: Nothing to eat or drink after midnight except a sip of water with medications (see medication instructions below)  FYI: For your safety, and to allow [name] to monitor your vital signs accurately during the surgery/procedure we request that   if you have artificial nails, gel coating, SNS etc. Please have those removed prior to your surgery/procedure. Not having the nail coverings /polish removed may result in cancellation or delay of your surgery/procedure.   Medication Instructions: Hold- Nothing to hold  Labs:  Your provider would like for you to return  7/3 or 7/5 to have the  following labs drawn: BMET and CBC. You do not need an appointment for the lab. Once in our office lobby there is a podium where you can sign in and ring the doorbell to alert [name] that you are here. The lab is open from 8:00 am to 4 pm; closed for lunch from 12:45pm-1:45pm.  You may also go to any of these [facility] locations:   [facility] - [address] ([facility]) - [address] [phone] [address]    You must have a responsible person to drive you home and stay in the waiting area during your procedure. Failure to do so could result in cancellation.  Bring your insurance cards.  *Special Note: Every effort is made to have your procedure done on time. Occasionally there are emergencies that occur at the hospital that may cause delays. Please be patient if a delay does occur.   ZIO XT- Long Term Monitor Instructions  Your physician has requested you wear a ZIO patch monitor for 14 days.  This is a single patch monitor. [employer] supplies one patch monitor per enrollment. Additional stickers are not available. Please do not apply patch if you will be having a Nuclear Stress Test,  Echocardiogram, Cardiac CT, MRI, or Chest Xray during the period you would be wearing the  monitor. The patch cannot be worn during these tests. You cannot remove and re-apply the  ZIO XT patch monitor.  Your ZIO patch monitor will be mailed 3 day USPS to your address on file.  It may take 3-5 days  to receive your monitor after you have been enrolled.  Once you have received your monitor, please review the enclosed instructions. Your monitor  has already been registered assigning a specific monitor serial # to you.  Billing and Patient Assistance Program Information  We have supplied [employer] with any of your insurance information on file for billing purposes. [employer] offers a sliding scale Patient Assistance Program for patients that do not have   insurance, or whose insurance does not completely cover the cost of the ZIO monitor.  You must apply for the Patient Assistance Program to qualify for this discounted rate.  To apply, please call [employer] at [phone], select option 4, select option 2, ask to apply for  Patient Assistance Program. Meredeth Ide will ask your household income, and how many people  are in your household. They will quote your out-of-pocket cost based on that information.  [employer] will also be able to set up a 50-month, interest-free payment plan if needed.  Applying the monitor   Shave hair from upper left chest.  Hold abrader disc by orange tab. Rub abrader in 40 strokes over the upper left chest as  indicated in your monitor instructions.  Clean area with 4 enclosed alcohol pads. Let dry.  Apply patch as indicated in monitor instructions. Patch will be placed under collarbone on left  side of chest with arrow pointing upward.  Rub patch adhesive wings for 2 minutes. Remove white label marked "1". Remove the white  label marked "2". Rub patch adhesive wings for 2 additional minutes.  While looking in a mirror, press and release button in center of patch. A small green light will  flash 3-4 times. This will be your only indicator that the monitor has been turned on.  Do not shower for the first 24 hours. You may shower after the first 24 hours.  Press the button if you feel a symptom. You will hear a small click. Record Date, Time and  Symptom in the Patient Logbook.  When you are ready to remove the patch, follow instructions on the last 2 pages of Patient  Logbook. Stick patch monitor onto the last page of Patient Logbook.  Place Patient Logbook in the blue and white box. Use locking tab on box and tape box closed  securely. The blue and white box has prepaid postage on it. Please place it in the mailbox as  soon as possible. Your physician should have your test results approximately 7 days after the  monitor  has been mailed back to [facility]  Call [facility] Customer Care at [phone] if you have questions regarding  your ZIO XT patch monitor. Call them immediately if you see an orange light blinking on your  monitor.  If your monitor falls off in less than 4 days, contact our Monitor department at [phone].  If your monitor becomes loose or falls off after 4 days call [employer] at [phone] for  suggestions on securing your monitor
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
## 2021-12-23 ENCOUNTER — Encounter: Payer: Self-pay | Admitting: Cardiovascular Disease

## 2021-12-23 DIAGNOSIS — I4891 Unspecified atrial fibrillation: Secondary | ICD-10-CM | POA: Diagnosis not present

## 2021-12-23 DIAGNOSIS — I639 Cerebral infarction, unspecified: Secondary | ICD-10-CM

## 2021-12-24 MED ORDER — CLOPIDOGREL BISULFATE 75 MG PO TABS: 75.0000 mg | ORAL_TABLET | Freq: Every day | ORAL | 3 refills | Status: DC

## 2021-12-26 DIAGNOSIS — R531 Weakness: Secondary | ICD-10-CM | POA: Diagnosis not present

## 2022-01-09 ENCOUNTER — Encounter (HOSPITAL_COMMUNITY): Payer: Self-pay | Admitting: Cardiovascular Disease

## 2022-01-11 DIAGNOSIS — E291 Testicular hypofunction: Secondary | ICD-10-CM | POA: Diagnosis not present

## 2022-01-11 DIAGNOSIS — R531 Weakness: Secondary | ICD-10-CM | POA: Diagnosis not present

## 2022-01-14 DIAGNOSIS — R531 Weakness: Secondary | ICD-10-CM | POA: Diagnosis not present

## 2022-01-17 DIAGNOSIS — I639 Cerebral infarction, unspecified: Secondary | ICD-10-CM | POA: Diagnosis not present

## 2022-01-17 DIAGNOSIS — Q2112 Patent foramen ovale: Secondary | ICD-10-CM | POA: Diagnosis not present

## 2022-01-17 DIAGNOSIS — R531 Weakness: Secondary | ICD-10-CM | POA: Diagnosis not present

## 2022-01-18 ENCOUNTER — Other Ambulatory Visit: Payer: Self-pay | Admitting: *Deleted

## 2022-01-18 DIAGNOSIS — I639 Cerebral infarction, unspecified: Secondary | ICD-10-CM

## 2022-01-18 LAB — BASIC METABOLIC PANEL
BUN/Creatinine Ratio: 15 (ref 10–24)
BUN: 21 mg/dL (ref 8–27)
CO2: 22 mmol/L (ref 20–29)
Calcium: 9.9 mg/dL (ref 8.6–10.2)
Chloride: 104 mmol/L (ref 96–106)
Creatinine, Ser: 1.39 mg/dL — ABNORMAL HIGH (ref 0.76–1.27)
Glucose: 77 mg/dL (ref 70–99)
Potassium: 4.7 mmol/L (ref 3.5–5.2)
Sodium: 143 mmol/L (ref 134–144)
eGFR: 56 mL/min/{1.73_m2} — ABNORMAL LOW (ref >59)

## 2022-01-18 LAB — CBC
Hematocrit: 44.6 % (ref 37.5–51.0)
Hemoglobin: 15 g/dL (ref 13.0–17.7)
MCH: 28.6 pg (ref 26.6–33.0)
MCHC: 33.6 g/dL (ref 31.5–35.7)
MCV: 85 fL (ref 79–97)
Platelets: 330 10*3/uL (ref 150–450)
RBC: 5.25 x10E6/uL (ref 4.14–5.80)
RDW: 15.1 % (ref 11.6–15.4)
WBC: 9.6 10*3/uL (ref 3.4–10.8)

## 2022-01-20 NOTE — Anesthesia Preprocedure Evaluation (Signed)
Anesthesia Evaluation  Patient identified by MRN, date of birth, ID band Patient awake    Reviewed: Allergy & Precautions, NPO status , Patient's Chart, lab work & pertinent test results  Airway Mallampati: III  TM Distance: >3 FB Neck ROM: Full    Dental no notable dental hx. (+) Dental Advisory Given   Pulmonary sleep apnea , former smoker,    Pulmonary exam normal        Cardiovascular negative cardio ROS Normal cardiovascular exam  PFO on echo 11/2021  Echo 08/01/2017 Study Conclusions   - Left ventricle: The cavity size was normal. Wall thickness was  normal. Systolic function was normal. The estimated ejection  fraction was in the range of 55% to 60%. Wall motion was normal;  there were no regional wall motion abnormalities. Doppler  parameters are consistent with abnormal left ventricular  relaxation (grade 1 diastolic dysfunction).   Impressions:   - Normal LV systolic function; mild diastolic dysfunction.    Neuro/Psych PSYCHIATRIC DISORDERS Anxiety Depression  stroke 11/2021 CVA    GI/Hepatic negative GI ROS, Neg liver ROS,   Endo/Other  negative endocrine ROS  Renal/GU negative Renal ROS     Musculoskeletal negative musculoskeletal ROS (+)   Abdominal   Peds  Hematology negative hematology ROS (+)   Anesthesia Other Findings   Reproductive/Obstetrics                            Anesthesia Physical Anesthesia Plan  ASA: 3  Anesthesia Plan: MAC   Post-op Pain Management: Minimal or no pain anticipated   Induction:   PONV Risk Score and Plan: 1 and Propofol infusion  Airway Management Planned: Simple Face Mask, Natural Airway and Nasal Cannula  Additional Equipment:   Intra-op Plan:   Post-operative Plan:   Informed Consent:     Dental advisory given  Plan Discussed with: Anesthesiologist and CRNA  Anesthesia Plan Comments:         Anesthesia Quick Evaluation

## 2022-01-21 ENCOUNTER — Other Ambulatory Visit: Payer: Self-pay

## 2022-01-21 ENCOUNTER — Encounter (HOSPITAL_COMMUNITY)
Admission: RE | Disposition: A | Discharge status: Home / Self Care | Payer: Self-pay | Source: Ambulatory Visit | Attending: Cardiovascular Disease

## 2022-01-21 ENCOUNTER — Ambulatory Visit (HOSPITAL_COMMUNITY)
Admission: RE | Admit: 2022-01-21 | Discharge: 2022-01-21 | Disposition: A | Discharge status: Home / Self Care | Payer: Medicare Other | Source: Ambulatory Visit | Attending: Cardiovascular Disease | Admitting: Cardiovascular Disease

## 2022-01-21 ENCOUNTER — Ambulatory Visit (HOSPITAL_BASED_OUTPATIENT_CLINIC_OR_DEPARTMENT_OTHER)
Admission: RE | Admit: 2022-01-21 | Discharge: 2022-01-21 | Disposition: A | Discharge status: Home / Self Care | Payer: Medicare Other | Source: Ambulatory Visit | Attending: Cardiovascular Disease | Admitting: Cardiovascular Disease

## 2022-01-21 ENCOUNTER — Ambulatory Visit (HOSPITAL_BASED_OUTPATIENT_CLINIC_OR_DEPARTMENT_OTHER): Payer: Medicare Other | Admitting: Anesthesiology

## 2022-01-21 ENCOUNTER — Encounter (HOSPITAL_COMMUNITY): Payer: Self-pay | Admitting: Cardiovascular Disease

## 2022-01-21 ENCOUNTER — Ambulatory Visit (HOSPITAL_COMMUNITY): Payer: Medicare Other | Admitting: Anesthesiology

## 2022-01-21 DIAGNOSIS — G4733 Obstructive sleep apnea (adult) (pediatric): Secondary | ICD-10-CM | POA: Diagnosis not present

## 2022-01-21 DIAGNOSIS — Q2112 Patent foramen ovale: Secondary | ICD-10-CM

## 2022-01-21 DIAGNOSIS — I639 Cerebral infarction, unspecified: Secondary | ICD-10-CM | POA: Diagnosis not present

## 2022-01-21 DIAGNOSIS — Z87891 Personal history of nicotine dependence: Secondary | ICD-10-CM

## 2022-01-21 DIAGNOSIS — E669 Obesity, unspecified: Secondary | ICD-10-CM | POA: Diagnosis not present

## 2022-01-21 DIAGNOSIS — Z6838 Body mass index (BMI) 38.0-38.9, adult: Secondary | ICD-10-CM | POA: Diagnosis not present

## 2022-01-21 DIAGNOSIS — I10 Essential (primary) hypertension: Secondary | ICD-10-CM | POA: Insufficient documentation

## 2022-01-21 DIAGNOSIS — F418 Other specified anxiety disorders: Secondary | ICD-10-CM

## 2022-01-21 DIAGNOSIS — I081 Rheumatic disorders of both mitral and tricuspid valves: Secondary | ICD-10-CM | POA: Diagnosis not present

## 2022-01-21 HISTORY — PX: BUBBLE STUDY: SHX6837

## 2022-01-21 HISTORY — PX: TEE WITHOUT CARDIOVERSION: SHX5443

## 2022-01-21 SURGERY — ECHOCARDIOGRAM, TRANSESOPHAGEAL
Anesthesia: Monitor Anesthesia Care

## 2022-01-21 MED ORDER — LIDOCAINE HCL (CARDIAC) PF 100 MG/5ML IV SOSY
PREFILLED_SYRINGE | INTRAVENOUS | Status: DC | PRN
  Administered 2022-01-21: 100 mg via INTRAVENOUS

## 2022-01-21 MED ORDER — PROPOFOL 10 MG/ML IV BOLUS
INTRAVENOUS | Status: DC | PRN
  Administered 2022-01-21: 30 mg via INTRAVENOUS

## 2022-01-21 MED ORDER — SODIUM CHLORIDE 0.9 % IV SOLN: INTRAVENOUS | Status: DC

## 2022-01-21 MED ORDER — BUTAMBEN-TETRACAINE-BENZOCAINE 2-2-14 % EX AERO
INHALATION_SPRAY | CUTANEOUS | Status: DC | PRN
  Administered 2022-01-21: 1 via TOPICAL

## 2022-01-21 MED ORDER — PROPOFOL 500 MG/50ML IV EMUL
INTRAVENOUS | Status: DC | PRN
  Administered 2022-01-21: 125 ug/kg/min via INTRAVENOUS

## 2022-01-21 NOTE — Transfer of Care (Signed)
Immediate Anesthesia Transfer of Care Note  Patient: Paul Hinton  Procedure(s) Performed: TRANSESOPHAGEAL ECHOCARDIOGRAM (TEE) BUBBLE STUDY  Patient Location: PACU  Anesthesia Type:MAC  Level of Consciousness: drowsy and patient cooperative  Airway & Oxygen Therapy: Patient Spontanous Breathing  Post-op Assessment: Report given to RN and Post -op Vital signs reviewed and stable  Post vital signs: Reviewed and stable  Last Vitals:  Vitals Value Taken Time  BP 114/74 01/21/22 0851  Temp 36.5 C 01/21/22 0851  Pulse 64 01/21/22 0854  Resp 15 01/21/22 0854  SpO2 95 % 01/21/22 0854  Vitals shown include unvalidated device data.  Last Pain:  Vitals:   01/21/22 0851  TempSrc: Temporal  PainSc: 0-No pain         Complications: No notable events documented.
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
## 2022-01-21 NOTE — CV Procedure (Signed)
    TRANSESOPHAGEAL ECHOCARDIOGRAM   NAME:  Paul Hinton    MRN: [id number] DOB:  [birth year]    ADMIT DATE: 01/21/2022  INDICATIONS: Stroke  PROCEDURE:   Informed consent was obtained prior to the procedure. The risks, benefits and alternatives for the procedure were discussed and the patient comprehended these risks.  Risks include, but are not limited to, cough, sore throat, vomiting, nausea, somnolence, esophageal and stomach trauma or perforation, bleeding, low blood pressure, aspiration, pneumonia, infection, trauma to the teeth and death.    Procedural time out performed. The oropharynx was anesthetized with topical 1% benzocaine.    Anesthesia was administered by Dr. Krista Blue.  The patient was administered 360 mg of propofol and 100 mg of lidocaine to achieve and maintain moderate conscious sedation.  The patient's heart rate, blood pressure, and oxygen saturation are monitored continuously during the procedure. The period of conscious sedation is 11 minutes, of which I was present face-to-face 100% of this time.   The transesophageal probe was inserted in the esophagus and stomach without difficulty and multiple views were obtained.   COMPLICATIONS:    There were no immediate complications.  KEY FINDINGS:  Large PFO with R to L shunting.  No LAA thrombus.  Normal LV/RV function.  Full report to follow. Further management per primary team.   Gerri Spore T. Flora Lipps, MD, [facility]  [facility]  [address], [address] [place], [place] [address] [phone]  8:45 AM

## 2022-01-21 NOTE — Discharge Instructions (Signed)

## 2022-01-21 NOTE — Anesthesia Postprocedure Evaluation (Signed)
Anesthesia Post Note  Patient: Paul Hinton  Procedure(s) Performed: TRANSESOPHAGEAL ECHOCARDIOGRAM (TEE) BUBBLE STUDY     Patient location during evaluation: Endoscopy Anesthesia Type: MAC Level of consciousness: awake and alert Pain management: pain level controlled Vital Signs Assessment: post-procedure vital signs reviewed and stable Respiratory status: spontaneous breathing, nonlabored ventilation, respiratory function stable and patient connected to nasal cannula oxygen Cardiovascular status: blood pressure returned to baseline and stable Postop Assessment: no apparent nausea or vomiting Anesthetic complications: no   No notable events documented.  Last Vitals:  Vitals:   01/21/22 0910 01/21/22 0915  BP: 130/81 140/89  Pulse: (!) 56 (!) 54  Resp: 16 (!) 9  Temp:    SpO2: 97% 99%    Last Pain:  Vitals:   01/21/22 0915  TempSrc:   PainSc: 0-No pain                 Numair Masden DANIEL

## 2022-01-22 ENCOUNTER — Encounter (HOSPITAL_COMMUNITY): Payer: Self-pay | Admitting: Cardiovascular Disease

## 2022-01-22 DIAGNOSIS — R531 Weakness: Secondary | ICD-10-CM | POA: Diagnosis not present

## 2022-01-25 ENCOUNTER — Ambulatory Visit (INDEPENDENT_AMBULATORY_CARE_PROVIDER_SITE_OTHER): Payer: Medicare Other | Admitting: Cardiovascular Disease

## 2022-01-25 ENCOUNTER — Encounter: Payer: Self-pay | Admitting: Cardiovascular Disease

## 2022-01-25 VITALS — BP 140/80 | HR 65 | Ht 70.0 in | Wt 274.0 lb

## 2022-01-25 DIAGNOSIS — I639 Cerebral infarction, unspecified: Secondary | ICD-10-CM | POA: Diagnosis not present

## 2022-01-25 DIAGNOSIS — Q2112 Patent foramen ovale: Secondary | ICD-10-CM | POA: Diagnosis not present

## 2022-01-25 DIAGNOSIS — E7800 Pure hypercholesterolemia, unspecified: Secondary | ICD-10-CM

## 2022-01-25 DIAGNOSIS — G4733 Obstructive sleep apnea (adult) (pediatric): Secondary | ICD-10-CM | POA: Diagnosis not present

## 2022-01-25 DIAGNOSIS — Z9081 Acquired absence of spleen: Secondary | ICD-10-CM
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
## 2022-01-25 NOTE — Patient Instructions (Signed)

## 2022-01-29 DIAGNOSIS — R531 Weakness: Secondary | ICD-10-CM | POA: Diagnosis not present

## 2022-02-01 ENCOUNTER — Encounter: Payer: Self-pay | Admitting: Cardiovascular Disease

## 2022-02-01 ENCOUNTER — Ambulatory Visit: Payer: Medicare Other | Admitting: Cardiovascular Disease

## 2022-02-01 VITALS — BP 116/80 | HR 61 | Ht 70.0 in | Wt 274.4 lb

## 2022-02-01 DIAGNOSIS — Q2112 Patent foramen ovale: Secondary | ICD-10-CM | POA: Diagnosis not present

## 2022-02-01 DIAGNOSIS — Z01810 Encounter for preprocedural cardiovascular examination: Secondary | ICD-10-CM | POA: Diagnosis not present

## 2022-02-01 DIAGNOSIS — R531 Weakness: Secondary | ICD-10-CM | POA: Diagnosis not present

## 2022-02-01 DIAGNOSIS — I253 Aneurysm of heart: Secondary | ICD-10-CM | POA: Diagnosis not present
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
## 2022-02-01 NOTE — Patient Instructions (Signed)
Medication Instructions:  Your physician recommends that you continue on your current medications as directed. Please refer to the Current Medication list given to you today.  *If you need a refill on your cardiac medications before your next appointment, please call your pharmacy*   Lab Work: BMET, CBC Today If you have labs (blood work) drawn today and your tests are completely normal, you will receive your results only by: MyChart Message (if you have MyChart) OR A paper copy in the mail If you have any lab test that is abnormal or we need to change your treatment, we will call you to review the results.   Testing/Procedures: PFO Closure (02/18/22)  Follow-Up: At [facility], you and your health needs are our priority.  As part of our continuing mission to provide you with exceptional heart care, we have created designated Provider Care Teams.  These Care Teams include your primary Cardiologist (physician) and Advanced Practice Providers (APPs -  Physician Assistants and Nurse Practitioners) who all work together to provide you with the care you need, when you need it.  Your next appointment:   Structural Team Will Follow (1 month, 1 year)  The format for your next appointment:   In Person  Provider:   Tonny Bollman, MD {     Important Information About Sugar

## 2022-02-02 LAB — BASIC METABOLIC PANEL
BUN/Creatinine Ratio: 19 (ref 10–24)
BUN: 23 mg/dL (ref 8–27)
CO2: 22 mmol/L (ref 20–29)
Calcium: 10 mg/dL (ref 8.6–10.2)
Chloride: 105 mmol/L (ref 96–106)
Creatinine, Ser: 1.18 mg/dL (ref 0.76–1.27)
Glucose: 120 mg/dL — ABNORMAL HIGH (ref 70–99)
Potassium: 4.5 mmol/L (ref 3.5–5.2)
Sodium: 142 mmol/L (ref 134–144)
eGFR: 68 mL/min/{1.73_m2} (ref >59)

## 2022-02-02 LAB — CBC
Hematocrit: 43.7 % (ref 37.5–51.0)
Hemoglobin: 15.2 g/dL (ref 13.0–17.7)
MCH: 29.5 pg (ref 26.6–33.0)
MCHC: 34.8 g/dL (ref 31.5–35.7)
MCV: 85 fL (ref 79–97)
Platelets: 336 10*3/uL (ref 150–450)
RBC: 5.16 x10E6/uL (ref 4.14–5.80)
RDW: 15.7 % — ABNORMAL HIGH (ref 11.6–15.4)
WBC: 6.7 10*3/uL (ref 3.4–10.8)

## 2022-02-04 DIAGNOSIS — R531 Weakness: Secondary | ICD-10-CM | POA: Diagnosis not present

## 2022-02-06 DIAGNOSIS — R531 Weakness: Secondary | ICD-10-CM | POA: Diagnosis not present

## 2022-02-12 DIAGNOSIS — M1712 Unilateral primary osteoarthritis, left knee: Secondary | ICD-10-CM | POA: Diagnosis not present

## 2022-02-12 DIAGNOSIS — M67911 Unspecified disorder of synovium and tendon, right shoulder: Secondary | ICD-10-CM | POA: Diagnosis not present

## 2022-02-12 DIAGNOSIS — M25562 Pain in left knee: Secondary | ICD-10-CM | POA: Diagnosis not present

## 2022-02-12 DIAGNOSIS — M25511 Pain in right shoulder: Secondary | ICD-10-CM | POA: Diagnosis not present

## 2022-02-13 DIAGNOSIS — R531 Weakness: Secondary | ICD-10-CM | POA: Diagnosis not present

## 2022-02-14 ENCOUNTER — Telehealth: Payer: Self-pay | Admitting: *Deleted

## 2022-02-14 NOTE — Telephone Encounter (Signed)
PFO Closure scheduled at [facility] for: Monday February 18, 2022 11:30 AM Arrival time and place: [facility] Main Entrance A at: 9:30 AM   Nothing to eat after midnight prior to procedure, clear liquids until 5 AM day of procedure.  Medication instructions: -Usual morning medications can be taken with sips of water including aspirin 81 mg and Plavix 75 mg  Confirmed patient has responsible adult to drive home post procedure and be with patient first 24 hours after arriving home.  Patient reports no new symptoms concerning for COVID-19 in the past 10 days.  Reviewed procedure instructions with patient.

## 2022-02-18 ENCOUNTER — Ambulatory Visit (HOSPITAL_BASED_OUTPATIENT_CLINIC_OR_DEPARTMENT_OTHER): Payer: Medicare Other

## 2022-02-18 ENCOUNTER — Other Ambulatory Visit: Payer: Self-pay

## 2022-02-18 ENCOUNTER — Encounter (HOSPITAL_COMMUNITY)
Admission: RE | Disposition: A | Discharge status: Home / Self Care | Payer: Self-pay | Source: Home / Self Care | Attending: Cardiovascular Disease

## 2022-02-18 ENCOUNTER — Ambulatory Visit (HOSPITAL_COMMUNITY)
Admission: RE | Admit: 2022-02-18 | Discharge: 2022-02-18 | Disposition: A | Discharge status: Home / Self Care | Payer: Medicare Other | Attending: Cardiovascular Disease | Admitting: Cardiovascular Disease

## 2022-02-18 DIAGNOSIS — I10 Essential (primary) hypertension: Secondary | ICD-10-CM | POA: Insufficient documentation

## 2022-02-18 DIAGNOSIS — Z87891 Personal history of nicotine dependence: Secondary | ICD-10-CM | POA: Diagnosis not present

## 2022-02-18 DIAGNOSIS — Q2112 Patent foramen ovale: Secondary | ICD-10-CM | POA: Diagnosis not present

## 2022-02-18 DIAGNOSIS — I639 Cerebral infarction, unspecified: Secondary | ICD-10-CM

## 2022-02-18 DIAGNOSIS — G4733 Obstructive sleep apnea (adult) (pediatric): Secondary | ICD-10-CM | POA: Diagnosis not present

## 2022-02-18 DIAGNOSIS — I253 Aneurysm of heart: Secondary | ICD-10-CM

## 2022-02-18 HISTORY — DX: Patent foramen ovale: Q21.12

## 2022-02-18 HISTORY — PX: PATENT FORAMEN OVALE(PFO) CLOSURE: CATH118300

## 2022-02-18 LAB — POCT ACTIVATED CLOTTING TIME
Activated Clotting Time: 197 seconds
Activated Clotting Time: 227 seconds

## 2022-02-18 LAB — ECHOCARDIOGRAM LIMITED
Height: 70 in
Weight: 4240 oz

## 2022-02-18 SURGERY — PATENT FORAMEN OVALE (PFO) CLOSURE
Anesthesia: LOCAL

## 2022-02-18 MED ORDER — ACETAMINOPHEN 325 MG PO TABS: 650.0000 mg | ORAL_TABLET | ORAL | Status: DC | PRN

## 2022-02-18 MED ORDER — HYDRALAZINE HCL 20 MG/ML IJ SOLN: 10.0000 mg | INTRAMUSCULAR | Status: DC | PRN

## 2022-02-18 MED ORDER — HEPARIN (PORCINE) IN NACL 1000-0.9 UT/500ML-% IV SOLN
INTRAVENOUS | Status: AC
  Filled 2022-02-18: qty 1000

## 2022-02-18 MED ORDER — HEPARIN SODIUM (PORCINE) 1000 UNIT/ML IJ SOLN
INTRAMUSCULAR | Status: AC
  Filled 2022-02-18: qty 10

## 2022-02-18 MED ORDER — SODIUM CHLORIDE 0.9 % IV SOLN: 250.0000 mL | INTRAVENOUS | Status: DC | PRN

## 2022-02-18 MED ORDER — MIDAZOLAM HCL 2 MG/2ML IJ SOLN
INTRAMUSCULAR | Status: AC
  Filled 2022-02-18: qty 2

## 2022-02-18 MED ORDER — LABETALOL HCL 5 MG/ML IV SOLN: 10.0000 mg | INTRAVENOUS | Status: DC | PRN

## 2022-02-18 MED ORDER — SODIUM CHLORIDE 0.9 % WEIGHT BASED INFUSION: 1.0000 mL/kg/h | INTRAVENOUS | Status: DC

## 2022-02-18 MED ORDER — ASPIRIN 81 MG PO CHEW: 81.0000 mg | CHEWABLE_TABLET | ORAL | Status: DC

## 2022-02-18 MED ORDER — FENTANYL CITRATE (PF) 100 MCG/2ML IJ SOLN
INTRAMUSCULAR | Status: DC | PRN
  Administered 2022-02-18: 50 ug via INTRAVENOUS
  Administered 2022-02-18: 25 ug via INTRAVENOUS

## 2022-02-18 MED ORDER — HEPARIN SODIUM (PORCINE) 1000 UNIT/ML IJ SOLN
INTRAMUSCULAR | Status: DC | PRN
  Administered 2022-02-18: 5000 [IU] via INTRAVENOUS
  Administered 2022-02-18: 10000 [IU] via INTRAVENOUS

## 2022-02-18 MED ORDER — VANCOMYCIN HCL IN DEXTROSE 1-5 GM/200ML-% IV SOLN: INTRAVENOUS | Status: DC | PRN

## 2022-02-18 MED ORDER — MIDAZOLAM HCL 2 MG/2ML IJ SOLN
INTRAMUSCULAR | Status: DC | PRN
  Administered 2022-02-18 (×2): 2 mg via INTRAVENOUS

## 2022-02-18 MED ORDER — SODIUM CHLORIDE 0.9% FLUSH
3.0000 mL | Freq: Two times a day (BID) | INTRAVENOUS | Status: DC
Start: 2022-02-18 — End: 2022-02-18

## 2022-02-18 MED ORDER — FENTANYL CITRATE (PF) 100 MCG/2ML IJ SOLN
INTRAMUSCULAR | Status: AC
  Filled 2022-02-18: qty 2

## 2022-02-18 MED ORDER — LIDOCAINE HCL (PF) 1 % IJ SOLN
INTRAMUSCULAR | Status: AC
  Filled 2022-02-18: qty 30

## 2022-02-18 MED ORDER — SODIUM CHLORIDE 0.9 % WEIGHT BASED INFUSION
3.0000 mL/kg/h | INTRAVENOUS | Status: AC
  Administered 2022-02-18: 3 mL/kg/h via INTRAVENOUS

## 2022-02-18 MED ORDER — VANCOMYCIN HCL 1500 MG/300ML IV SOLN
1500.0000 mg | INTRAVENOUS | Status: AC
  Administered 2022-02-18: 1500 mg via INTRAVENOUS
  Filled 2022-02-18 (×3): qty 300

## 2022-02-18 MED ORDER — SODIUM CHLORIDE 0.9% FLUSH: 3.0000 mL | Freq: Two times a day (BID) | INTRAVENOUS | Status: DC

## 2022-02-18 MED ORDER — HEPARIN (PORCINE) IN NACL 1000-0.9 UT/500ML-% IV SOLN
INTRAVENOUS | Status: DC | PRN
  Administered 2022-02-18 (×2): 500 mL

## 2022-02-18 MED ORDER — SODIUM CHLORIDE 0.9 % IV SOLN
INTRAVENOUS | Status: AC
Start: 2022-02-18 — End: 2022-02-18

## 2022-02-18 MED ORDER — ONDANSETRON HCL 4 MG/2ML IJ SOLN: 4.0000 mg | Freq: Four times a day (QID) | INTRAMUSCULAR | Status: DC | PRN

## 2022-02-18 MED ORDER — SODIUM CHLORIDE 0.9% FLUSH: 3.0000 mL | INTRAVENOUS | Status: DC | PRN

## 2022-02-18 MED ORDER — CLOPIDOGREL BISULFATE 75 MG PO TABS: 75.0000 mg | ORAL_TABLET | ORAL | Status: DC

## 2022-02-18 MED ORDER — LIDOCAINE HCL (PF) 1 % IJ SOLN
INTRAMUSCULAR | Status: DC | PRN
  Administered 2022-02-18: 15 mL

## 2022-02-18 SURGICAL SUPPLY — 16 items
CATH ACUNAV 8FR 90CM (CATHETERS) ×1 IMPLANT
CATH EXPO 5F MPA-1 (CATHETERS) ×1 IMPLANT
CLOSURE PERCLOSE PROSTYLE (VASCULAR PRODUCTS) ×3 IMPLANT
COVER SWIFTLINK CONNECTOR (BAG) ×1 IMPLANT
GUIDEWIRE AMPLATZER 1.5JX260 (WIRE) ×1 IMPLANT
OCCLUDER PFO TALISMAN 25-18 (Prosthesis & Implant Heart) IMPLANT
PACK CARDIAC CATHETERIZATION (CUSTOM PROCEDURE TRAY) ×2 IMPLANT
PROTECTION STATION PRESSURIZED (MISCELLANEOUS) ×2
SHEATH DELIVERY TALISMAN 8F 80 (SHEATH) IMPLANT
SHEATH INTROD W/O MIN 9FR 25CM (SHEATH) ×1 IMPLANT
SHEATH PINNACLE 8F 10CM (SHEATH) ×1 IMPLANT
SHEATH PROBE COVER 6X72 (BAG) ×1 IMPLANT
STATION PROTECTION PRESSURIZED (MISCELLANEOUS) IMPLANT
TALISMAN DELIVERY SHEATH 8F 80 (SHEATH) ×2
TALISMAN PFO OCCLUDER 25-18 (Prosthesis & Implant Heart) ×2 IMPLANT
WIRE EMERALD 3MM-J .035X150CM (WIRE) ×1 IMPLANT

## 2022-02-18 NOTE — Progress Notes (Addendum)
Pt with oozing at R groin at 1345, pressure held 15 minutes. No issues, pt states no pain. Site drainage marked. New ooze on bandage at 1440, pressure held another 15 minutes. No issues, no pain. Site drainage marked. MD Excell Seltzer paged.  Hand off given to Alvino Chapel, RN at 1540. MD requests another hour of bedrest before ambulating.

## 2022-02-18 NOTE — Discharge Instructions (Signed)

## 2022-02-18 NOTE — Interval H&P Note (Signed)
History and Physical Interval Note:  02/18/2022 10:05 AM  Paul Hinton  has presented today for surgery, with the diagnosis of pfo.  The various methods of treatment have been discussed with the patient and family. After consideration of risks, benefits and other options for treatment, the patient has consented to  Procedure(s): PATENT FORAMEN OVALE(PFO) CLOSURE (N/A) as a surgical intervention.  The patient's history has been reviewed, patient examined, no change in status, stable for surgery.  I have reviewed the patient's chart and labs.  Questions were answered to the patient's satisfaction.     Tonny Bollman

## 2022-02-18 NOTE — Progress Notes (Signed)
Echocardiogram 2D Echocardiogram has been performed.  Warren Lacy Geniva Lohnes RDCS 02/18/2022, 2:14 PM

## 2022-02-19 ENCOUNTER — Encounter (HOSPITAL_COMMUNITY): Payer: Self-pay | Admitting: Cardiovascular Disease

## 2022-02-25 ENCOUNTER — Encounter: Payer: Self-pay | Admitting: Cardiovascular Disease

## 2022-02-25 MED ORDER — CEPHALEXIN 500 MG PO CAPS
ORAL_CAPSULE | ORAL | 0 refills | Status: DC
Start: 2022-02-25 — End: 2022-03-20

## 2022-02-25 NOTE — Telephone Encounter (Signed)
I'm going to loop in PharmD. He's PCN allergic - at least reportedly it's ineffective for him. Used to use Clinda but think that's not current guideline. Will ask PharmD input for pre-dental antibiotic recommendation

## 2022-02-25 NOTE — Telephone Encounter (Signed)
Per OV note on 02/01/22:   He should remain on aspirin and clopidogrel which will be continued for 6 months following PFO closure.  He would then be on indefinite aspirin 81 mg.  He will need to follow SBE prophylaxis when indicated for a period of 6 months.  Will route to MD to verify which antibiotic he would like to prescribe.

## 2022-02-25 NOTE — Telephone Encounter (Signed)
Supple, Emeline Darling, RPH-CPP  You; Tonny Bollman, MD 2 hours ago (3:33 PM)    Recommend cephalexin 2g 30-60 mins before dental work    Medication sent to pharmacy on file and MyChart message sent to patient to make aware.

## 2022-02-26 DIAGNOSIS — R531 Weakness: Secondary | ICD-10-CM | POA: Diagnosis not present

## 2022-02-27 DIAGNOSIS — E291 Testicular hypofunction: Secondary | ICD-10-CM | POA: Diagnosis not present

## 2022-03-05 DIAGNOSIS — R531 Weakness: Secondary | ICD-10-CM | POA: Diagnosis not present

## 2022-03-06 DIAGNOSIS — E291 Testicular hypofunction: Secondary | ICD-10-CM | POA: Diagnosis not present

## 2022-03-11 DIAGNOSIS — H5213 Myopia, bilateral: Secondary | ICD-10-CM | POA: Diagnosis not present

## 2022-03-13 ENCOUNTER — Ambulatory Visit: Payer: Medicare Other | Admitting: Physician Assistant

## 2022-03-13 ENCOUNTER — Encounter: Payer: Self-pay | Admitting: Physician Assistant

## 2022-03-13 VITALS — BP 125/82 | HR 70 | Ht 70.0 in | Wt 273.0 lb

## 2022-03-13 DIAGNOSIS — I69392 Facial weakness following cerebral infarction: Secondary | ICD-10-CM

## 2022-03-13 DIAGNOSIS — R202 Paresthesia of skin: Secondary | ICD-10-CM

## 2022-03-13 NOTE — Progress Notes (Signed)
NEUROLOGY FOLLOW UP OFFICE NOTE  Paul Hinton [id number]  Assessment/Plan:   R posterior frontal strokes per MRI of the brain, no significant residual.   Risk factors include obesity, male over 83, recent long distance trip, testosterone injections, polycythemia, prior splenectomy,  hypertension and hyperlipidemia among others.  2D echo showed PFO, confirmed by TEE on 01/21/22.  There was no thrombosis, and his LV/RV function was normal.  There was no evidence of A-fib by event monitor. He quit tobacco use and has decreased his alcohol consumption. Denies any new signs or symptoms of stroke. Minimal  subjective residual weakness on LUE ( with normal strength on exam)  but doing PT4times a week.  He had PFO closure on 02/18/2022, tolerated the procedure well. He is on baby ASA daily and Plavix per Cards.    Continue baby aspirin daily Continue Plavix 75 mg daily for 6 months as per Cards  Change in lifestyle including Mediterranean diet, exercise, no tobacco. Recommend if possible discontinuation of hormonal shots, to replace with anastrozole as per urology Continue to decrease alcohol intake Continue PT and increase exercise daily. Follow-up  as needed  Subjective:     History of Present Illness 12/11/21 : Paul Hinton is a 66 y.o. right-handed male with a history of tobacco abuse, hypertension, OSA on BiPAP, polycythemia treated with frequent blood donation, osteoarthritis, obesity, hyperlipidemia, splenectomy, iron deficiency anemia , low testosterone on hormonal shots presenting for evaluation of recent posterior right frontal strokes. In review, about 10 days ago the patient was in his usual state of health, going to sleep around 10 PM, waking up at 7 in the morning with left hand weakness decreased sensation on the left arm towards the elbow and triceps area, left facial droop, slurred speech, left lower extremity ataxia.  He was to go wine tasting to [employer] in [place],  but he called his son in law who recommended that he be seen at the ED instead.  At the ED, his BP was 141/72, he was afebrile with normal O2 sats.  EKG was normal sinus rhythm, without any BBB or fascicular blocks.  ETOH was 0.07. CT angio of the head and neck was without significant carotid or vertebral artery stenosis.  There was no large vessel occlusion or significant proximal stenosis or aneurysm.  MRI of the head was remarkable for small foci of restricted diffusion and FLAIR hyperintensity in the posterior right frontal lobe consistent with acute infarcts.  No associated hemorrhage or mass effect.  Dopplers were negative for DVT.  2D echo with bubble study showed patent foramina ovale (PFO) with EF 55 to 60%.  He was given aspirin 81 mg and he was placed on a statin with Lipitor 40 mg nightly.  He began physical therapy, and he reports that his symptoms in his arm are about resolved.  His left leg weakness is resolved completely.  Cards outpatient and evaluation is pending.   Patient  never had a similar episode. Denies any history of TIA. Denies vertigo dizziness or vision changes. Denies headaches, or dysphagia. No confusion or seizures. Denies any chest pain, or shortness of breath. Denies any fever or chills, or night sweats. No tobacco. No new meds Takes testosterone  shots x 10 yrs, now reduced to ).3 ml increasing his anastrozole from 3 x a week to nightly.  Does take a regular ASA a day, with no other antiplatelets or anticoagulants. Recent long distance trips to [place] from May 6- 13. Denies recent  surgeries. No new stressors present in personal life-retired sedentary job.  Patient is compliant with his medications[facility]  He is not a diabetic. No family history of stroke. He does not have a spleen. He was discharged on 5/20 in stable condition on baby ASA daily.  PAST MEDICAL HISTORY: Past Medical History:  Diagnosis Date   Anxiety    Arthritis     Depression    History of kidney stones    Hyperlipidemia    OSA treated with BiPAP     MEDICATIONS: Current Outpatient Medications on File Prior to Visit  Medication Sig Dispense Refill   anastrozole (ARIMIDEX) 1 MG tablet Take 1 mg by mouth daily.     aspirin EC 81 MG tablet Take 81 mg by mouth daily.     atorvastatin (LIPITOR) 40 MG tablet Take 40 mg by mouth at bedtime.     cephALEXin (KEFLEX) 500 MG capsule Take 4 capsules by mouth 30-60 minutes prior to dental work 12 capsule 0   cholecalciferol (VITAMIN D) 25 MCG (1000 UNIT) tablet Take 1,000 Units by mouth daily.     clopidogrel (PLAVIX) 75 MG tablet Take 1 tablet (75 mg total) by mouth daily. 90 tablet 3   fexofenadine (ALLEGRA) 180 MG tablet Take 180 mg by mouth daily.     sildenafil (REVATIO) 20 MG tablet Take 20-100 mg by mouth daily as needed (ED).     testosterone cypionate (DEPOTESTOSTERONE CYPIONATE) 200 MG/ML injection Inject into the muscle once a week. 0.6 ml     No current facility-administered medications on file prior to visit.    ALLERGIES: Allergies  Allergen Reactions   Meperidine Other (See Comments)    hallucinations   Penicillin G Other (See Comments)    No longer effective after having multiple doses at age 55 for meningitis       Objective:   General: No acute distress.  Patient appears well groomed.   Head:  Normocephalic/atraumatic Eyes:  Fundi examined but not visualized Neck: supple, no paraspinal tenderness, full range of motion Heart:  Regular rate and rhythm Lungs:  Clear to auscultation bilaterally Back: No paraspinal tenderness Neurological Exam: alert and oriented to person, place, and time. Attention span and concentration intact, recent and remote memory intact, fund of knowledge intact.  Speech fluent and not dysarthric, language intact.  CN II-XII intact. Bulk and tone normal, muscle strength 5/5 throughout.  Sensation to light touch, temperature and vibration intact.  Deep tendon  reflexes 2+ throughout, toes downgoing.  Gait normal, Romberg negative.    Marlowe Kays, PA-C   CC: Kirby Funk, MD

## 2022-03-13 NOTE — Patient Instructions (Signed)
Follow up as needed Plavix 75 mg daily as per Dr. Excell Seltzer , ASA daily  Weight control

## 2022-03-19 DIAGNOSIS — R531 Weakness: Secondary | ICD-10-CM | POA: Diagnosis not present
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
## 2022-03-20 ENCOUNTER — Other Ambulatory Visit: Payer: Self-pay | Admitting: Cardiology

## 2022-03-20 ENCOUNTER — Ambulatory Visit: Payer: Medicare Other | Attending: Cardiology | Admitting: Cardiology

## 2022-03-20 VITALS — BP 130/90 | HR 60 | Ht 70.0 in | Wt 276.4 lb

## 2022-03-20 DIAGNOSIS — I639 Cerebral infarction, unspecified: Secondary | ICD-10-CM

## 2022-03-20 DIAGNOSIS — I253 Aneurysm of heart: Secondary | ICD-10-CM

## 2022-03-20 DIAGNOSIS — Q2112 Patent foramen ovale: Secondary | ICD-10-CM

## 2022-03-20 MED ORDER — AZITHROMYCIN 500 MG PO TABS: 500.0000 mg | ORAL_TABLET | ORAL | 0 refills | Status: DC

## 2022-03-20 NOTE — Patient Instructions (Signed)
Medication Instructions:  Your physician has recommended you make the following change in your medication:  TAKE AZITHROMYCIN 500 MG 1 HOUR PRIOR TO DENTAL CLEANINGS AND PROCEDURES. STOP KEFLEX *If you need a refill on your cardiac medications before your next appointment, please call your pharmacy*   Lab Work: NONE If you have labs (blood work) drawn today and your tests are completely normal, you will receive your results only by: MyChart Message (if you have MyChart) OR A paper copy in the mail If you have any lab test that is abnormal or we need to change your treatment, we will call you to review the results.   Testing/Procedures: NONE   Follow-Up: At [facility], you and your health needs are our priority.  As part of our continuing mission to provide you with exceptional heart care, we have created designated Provider Care Teams.  These Care Teams include your primary Cardiologist (physician) and Advanced Practice Providers (APPs -  Physician Assistants and Nurse Practitioners) who all work together to provide you with the care you need, when you need it.  We recommend signing up for the patient portal called "MyChart".  Sign up information is provided on this After Visit Summary.  MyChart is used to connect with patients for Virtual Visits (Telemedicine).  Patients are able to view lab/test results, encounter notes, upcoming appointments, etc.  Non-urgent messages can be sent to your provider as well.   To learn more about what you can do with MyChart, go to [email or web address].    Your next appointment:   KEEP SCHEDULED FOLLOW-UP  Important Information About Sugar

## 2022-03-26 DIAGNOSIS — R531 Weakness: Secondary | ICD-10-CM | POA: Diagnosis not present

## 2022-04-23 DIAGNOSIS — G4733 Obstructive sleep apnea (adult) (pediatric): Secondary | ICD-10-CM | POA: Diagnosis not present

## 2022-04-29 DIAGNOSIS — G4733 Obstructive sleep apnea (adult) (pediatric): Secondary | ICD-10-CM | POA: Diagnosis not present

## 2022-05-06 DIAGNOSIS — Z8673 Personal history of transient ischemic attack (TIA), and cerebral infarction without residual deficits: Secondary | ICD-10-CM | POA: Diagnosis not present

## 2022-05-06 DIAGNOSIS — R718 Other abnormality of red blood cells: Secondary | ICD-10-CM | POA: Diagnosis not present

## 2022-05-06 DIAGNOSIS — G4733 Obstructive sleep apnea (adult) (pediatric): Secondary | ICD-10-CM | POA: Diagnosis not present

## 2022-05-15 DIAGNOSIS — D751 Secondary polycythemia: Secondary | ICD-10-CM | POA: Diagnosis not present

## 2022-07-16 DIAGNOSIS — Z79899 Other long term (current) drug therapy: Secondary | ICD-10-CM | POA: Diagnosis not present

## 2022-08-28 DIAGNOSIS — R948 Abnormal results of function studies of other organs and systems: Secondary | ICD-10-CM | POA: Diagnosis not present

## 2022-08-28 DIAGNOSIS — E291 Testicular hypofunction: Secondary | ICD-10-CM | POA: Diagnosis not present

## 2022-09-04 DIAGNOSIS — E291 Testicular hypofunction: Secondary | ICD-10-CM | POA: Diagnosis not present

## 2022-09-04 DIAGNOSIS — N5201 Erectile dysfunction due to arterial insufficiency: Secondary | ICD-10-CM | POA: Diagnosis not present

## 2022-09-11 DIAGNOSIS — K080 Exfoliation of teeth due to systemic causes: Secondary | ICD-10-CM | POA: Diagnosis not present

## 2022-09-12 DIAGNOSIS — Z0000 Encounter for general adult medical examination without abnormal findings: Secondary | ICD-10-CM | POA: Diagnosis not present

## 2022-09-12 DIAGNOSIS — F325 Major depressive disorder, single episode, in full remission: Secondary | ICD-10-CM | POA: Diagnosis not present

## 2022-09-12 DIAGNOSIS — Z6841 Body Mass Index (BMI) 40.0 and over, adult: Secondary | ICD-10-CM | POA: Diagnosis not present

## 2022-09-12 DIAGNOSIS — E7800 Pure hypercholesterolemia, unspecified: Secondary | ICD-10-CM | POA: Diagnosis not present

## 2022-09-12 DIAGNOSIS — R7309 Other abnormal glucose: Secondary | ICD-10-CM | POA: Diagnosis not present

## 2022-09-12 DIAGNOSIS — E119 Type 2 diabetes mellitus without complications: Secondary | ICD-10-CM | POA: Diagnosis not present

## 2022-09-12 DIAGNOSIS — Z79899 Other long term (current) drug therapy: Secondary | ICD-10-CM | POA: Diagnosis not present

## 2022-09-13 ENCOUNTER — Other Ambulatory Visit: Payer: Self-pay | Admitting: Internal Medicine

## 2022-09-13 ENCOUNTER — Encounter: Payer: Self-pay | Admitting: Cardiovascular Disease

## 2022-09-13 DIAGNOSIS — M5136 Other intervertebral disc degeneration, lumbar region: Secondary | ICD-10-CM

## 2022-09-20 ENCOUNTER — Other Ambulatory Visit: Payer: Self-pay | Admitting: Internal Medicine

## 2022-09-20 DIAGNOSIS — K439 Ventral hernia without obstruction or gangrene: Secondary | ICD-10-CM

## 2022-09-23 ENCOUNTER — Other Ambulatory Visit: Payer: Self-pay | Admitting: Cardiovascular Disease

## 2022-09-27 DIAGNOSIS — G8929 Other chronic pain: Secondary | ICD-10-CM | POA: Diagnosis not present

## 2022-09-27 DIAGNOSIS — G959 Disease of spinal cord, unspecified: Secondary | ICD-10-CM | POA: Diagnosis not present

## 2022-09-27 DIAGNOSIS — Z6841 Body Mass Index (BMI) 40.0 and over, adult: Secondary | ICD-10-CM | POA: Diagnosis not present

## 2022-10-06 ENCOUNTER — Ambulatory Visit
Admission: RE | Admit: 2022-10-06 | Discharge: 2022-10-06 | Disposition: A | Discharge status: Home / Self Care | Payer: Medicare Other | Source: Ambulatory Visit | Attending: Internal Medicine | Admitting: Internal Medicine

## 2022-10-06 DIAGNOSIS — M5136 Other intervertebral disc degeneration, lumbar region: Secondary | ICD-10-CM

## 2022-10-06 MED ORDER — GADOPICLENOL 0.5 MMOL/ML IV SOLN
10.0000 mL | Freq: Once | INTRAVENOUS | Status: AC | PRN
  Administered 2022-10-06: 10 mL via INTRAVENOUS

## 2022-10-07 DIAGNOSIS — E291 Testicular hypofunction: Secondary | ICD-10-CM | POA: Diagnosis not present

## 2022-10-07 DIAGNOSIS — G4733 Obstructive sleep apnea (adult) (pediatric): Secondary | ICD-10-CM | POA: Diagnosis not present

## 2022-10-07 DIAGNOSIS — B356 Tinea cruris: Secondary | ICD-10-CM | POA: Diagnosis not present

## 2022-10-14 ENCOUNTER — Other Ambulatory Visit: Payer: Self-pay | Admitting: Neurosurgery

## 2022-10-14 DIAGNOSIS — G959 Disease of spinal cord, unspecified: Secondary | ICD-10-CM

## 2022-10-16 ENCOUNTER — Ambulatory Visit
Admission: RE | Admit: 2022-10-16 | Discharge: 2022-10-16 | Disposition: A | Discharge status: Home / Self Care | Payer: Medicare Other | Source: Ambulatory Visit | Attending: Internal Medicine | Admitting: Internal Medicine

## 2022-10-16 DIAGNOSIS — K5730 Diverticulosis of large intestine without perforation or abscess without bleeding: Secondary | ICD-10-CM | POA: Diagnosis not present

## 2022-10-16 DIAGNOSIS — K449 Diaphragmatic hernia without obstruction or gangrene: Secondary | ICD-10-CM | POA: Diagnosis not present

## 2022-10-16 DIAGNOSIS — K429 Umbilical hernia without obstruction or gangrene: Secondary | ICD-10-CM | POA: Diagnosis not present

## 2022-10-16 DIAGNOSIS — K439 Ventral hernia without obstruction or gangrene: Secondary | ICD-10-CM

## 2022-10-18 ENCOUNTER — Ambulatory Visit
Admission: RE | Admit: 2022-10-18 | Discharge: 2022-10-18 | Disposition: A | Discharge status: Home / Self Care | Payer: Medicare Other | Source: Ambulatory Visit | Attending: Neurosurgery | Admitting: Neurosurgery

## 2022-10-18 DIAGNOSIS — M4802 Spinal stenosis, cervical region: Secondary | ICD-10-CM | POA: Diagnosis not present

## 2022-10-18 DIAGNOSIS — G959 Disease of spinal cord, unspecified: Secondary | ICD-10-CM

## 2022-11-14 DIAGNOSIS — R0981 Nasal congestion: Secondary | ICD-10-CM | POA: Diagnosis not present

## 2022-11-14 DIAGNOSIS — J189 Pneumonia, unspecified organism: Secondary | ICD-10-CM | POA: Diagnosis not present

## 2022-11-14 DIAGNOSIS — R509 Fever, unspecified: Secondary | ICD-10-CM | POA: Diagnosis not present

## 2022-11-15 ENCOUNTER — Telehealth: Payer: Self-pay

## 2022-11-15 DIAGNOSIS — K429 Umbilical hernia without obstruction or gangrene: Secondary | ICD-10-CM | POA: Diagnosis not present

## 2022-11-15 NOTE — Telephone Encounter (Signed)
   Pre-operative Risk Assessment    Patient Name: Paul Hinton  DOB: [birth year] MRN: [id number]      Request for Surgical Clearance    Procedure:   Hernia Surgery   Date of Surgery:  Clearance TBD                                 Surgeon:  Dr. Manus Rudd Surgeon's Group or Practice Name:  [facility]  Phone number:  [phone] Fax number:  [phone]   Type of Clearance Requested:   - Medical  - Pharmacy:  Hold Aspirin and Clopidogrel (Plavix)     Type of Anesthesia:  General    Additional requests/questions:    Deforest Hoyles   11/15/2022, 4:49 PM

## 2022-11-18 ENCOUNTER — Telehealth: Payer: Self-pay | Admitting: *Deleted

## 2022-11-18 NOTE — Telephone Encounter (Signed)
Pt has been scheduled for tele pre op appt 11/21/22 @ 3 pm. Med rec and consent are done.

## 2022-11-18 NOTE — Telephone Encounter (Signed)
Left message to call back to schedule a tele pre op appt.  ?

## 2022-11-18 NOTE — Telephone Encounter (Signed)
Pt has been scheduled for tele pre op appt 11/21/22 @ 3 pm. Med rec and consent are done.    Patient Consent for Virtual Visit        Paul Hinton has provided verbal consent on 11/18/2022 for a virtual visit (video or telephone).   CONSENT FOR VIRTUAL VISIT FOR:  Paul Hinton  By participating in this virtual visit I agree to the following:  I hereby voluntarily request, consent and authorize [facility] and its employed or contracted physicians, physician assistants, nurse practitioners or other licensed health care professionals (the Practitioner), to provide me with telemedicine health care services (the "Services") as deemed necessary by the treating Practitioner. I acknowledge and consent to receive the Services by the Practitioner via telemedicine. I understand that the telemedicine visit will involve communicating with the Practitioner through live audiovisual communication technology and the disclosure of certain medical information by electronic transmission. I acknowledge that I have been given the opportunity to request an in-person assessment or other available alternative prior to the telemedicine visit and am voluntarily participating in the telemedicine visit.  I understand that I have the right to withhold or withdraw my consent to the use of telemedicine in the course of my care at any time, without affecting my right to future care or treatment, and that the Practitioner or I may terminate the telemedicine visit at any time. I understand that I have the right to inspect all information obtained and/or recorded in the course of the telemedicine visit and may receive copies of available information for a reasonable fee.  I understand that some of the potential risks of receiving the Services via telemedicine include:  Delay or interruption in medical evaluation due to technological equipment failure or disruption; Information transmitted may not be sufficient (e.g. poor  resolution of images) to allow for appropriate medical decision making by the Practitioner; and/or  In rare instances, security protocols could fail, causing a breach of personal health information.  Furthermore, I acknowledge that it is my responsibility to provide information about my medical history, conditions and care that is complete and accurate to the best of my ability. I acknowledge that Practitioner's advice, recommendations, and/or decision may be based on factors not within their control, such as incomplete or inaccurate data provided by me or distortions of diagnostic images or specimens that may result from electronic transmissions. I understand that the practice of medicine is not an exact science and that Practitioner makes no warranties or guarantees regarding treatment outcomes. I acknowledge that a copy of this consent can be made available to me via my patient portal [facility] MyChart), or I can request a printed copy by calling the office of [facility].    I understand that my insurance will be billed for this visit.   I have read or had this consent read to me. I understand the contents of this consent, which adequately explains the benefits and risks of the Services being provided via telemedicine.  I have been provided ample opportunity to ask questions regarding this consent and the Services and have had my questions answered to my satisfaction. I give my informed consent for the services to be provided through the use of telemedicine in my medical care

## 2022-11-18 NOTE — Telephone Encounter (Signed)
   Name: Paul Hinton  DOB: 21-Jan-1956  MRN: [id number]  Primary Cardiologist: Thurmon Fair, MD   Preoperative team, please contact this patient and set up a phone call appointment for further preoperative risk assessment. Please obtain consent and complete medication review. Thank you for your help.  I confirm that guidance regarding antiplatelet and oral anticoagulation therapy has been completed and, if necessary, noted below.  Per office protocol, he may hold Plavix and aspirin for 5-7 days prior to procedure and should resume as soon as hemodynamically stable postoperatively.    Carlos Levering, NP 11/18/2022, 11:08 AM [facility]

## 2022-11-18 NOTE — Telephone Encounter (Signed)
Patient returned call

## 2022-11-21 ENCOUNTER — Ambulatory Visit: Payer: Medicare Other | Attending: Internal Medicine

## 2022-11-21 DIAGNOSIS — Z01810 Encounter for preprocedural cardiovascular examination: Secondary | ICD-10-CM
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
## 2022-11-22 DIAGNOSIS — G959 Disease of spinal cord, unspecified: Secondary | ICD-10-CM | POA: Diagnosis not present

## 2022-11-22 DIAGNOSIS — M4316 Spondylolisthesis, lumbar region: Secondary | ICD-10-CM | POA: Diagnosis not present

## 2022-11-22 DIAGNOSIS — M48062 Spinal stenosis, lumbar region with neurogenic claudication: Secondary | ICD-10-CM | POA: Diagnosis not present

## 2022-11-27 NOTE — Pre-Procedure Instructions (Signed)
Surgical Instructions    Your procedure is scheduled on Dec 10, 2022.  Report to [facility] Main Entrance "A" at 8:30 A.M., then check in with the Admitting office.  Call this number if you have problems the morning of surgery:  [phone]  If you have any questions prior to your surgery date call [phone]: Open Monday-Friday 8am-4pm If you experience any cold or flu symptoms such as cough, fever, chills, shortness of breath, etc. between now and your scheduled surgery, please notify us at the above number.     Remember:  Do not eat after midnight the night before your surgery  You may drink clear liquids until 7:30 AM the morning of your surgery.   Clear liquids allowed are: Water, Non-Citrus Juices (without pulp), Carbonated Beverages, Clear Tea, Black Coffee Only (NO MILK, CREAM OR POWDERED CREAMER of any kind), and Gatorade.     Take these medicines the morning of surgery with A SIP OF WATER:  anastrozole (ARIMIDEX)   fexofenadine (ALLEGRA)    Follow your surgeon's instructions on when to stop Aspirin and clopidogrel (PLAVIX).  If no instructions were given by your surgeon then you will need to call the office to get those instructions.    As of today, STOP taking any Aspirin (unless otherwise instructed by your surgeon) Aleve, Naproxen, Ibuprofen, Motrin, Advil, Goody's, BC's, all herbal medications, fish oil, and all vitamins. This includes your medication: meloxicam (MOBIC)                      Do NOT Smoke (Tobacco/Vaping) for 24 hours prior to your procedure.  If you use a CPAP at night, you may bring your mask/headgear for your overnight stay.   Contacts, glasses, piercing's, hearing aid's, dentures or partials may not be worn into surgery, please bring cases for these belongings.    For patients admitted to the hospital, discharge time will be determined by your treatment team.   Patients discharged the day of surgery will not be allowed to drive home, and  someone needs to stay with them for 24 hours.  SURGICAL WAITING ROOM VISITATION Patients having surgery or a procedure may have no more than 2 support people in the waiting area - these visitors may rotate.   Children under the age of 4 must have an adult with them who is not the patient. If the patient needs to stay at the hospital during part of their recovery, the visitor guidelines for inpatient rooms apply. Pre-op nurse will coordinate an appropriate time for 1 support person to accompany patient in pre-op.  This support person may not rotate.   Please refer to the [facility] website for the visitor guidelines for Inpatients (after your surgery is over and you are in a regular room).    Special instructions:   [place]- Preparing For Surgery  Before surgery, you can play an important role. Because skin is not sterile, your skin needs to be as free of germs as possible. You can reduce the number of germs on your skin by washing with CHG (chlorahexidine gluconate) Soap before surgery.  CHG is an antiseptic cleaner which kills germs and bonds with the skin to continue killing germs even after washing.    Oral Hygiene is also important to reduce your risk of infection.  Remember - BRUSH YOUR TEETH THE MORNING OF SURGERY WITH YOUR REGULAR TOOTHPASTE  Please do not use if you have an allergy to CHG or antibacterial soaps. If your  skin becomes reddened/irritated stop using the CHG.  Do not shave (including legs and underarms) for at least 48 hours prior to first CHG shower. It is OK to shave your face.  Please follow these instructions carefully.   Shower the NIGHT BEFORE SURGERY and the MORNING OF SURGERY  If you chose to wash your hair, wash your hair first as usual with your normal shampoo.  After you shampoo, rinse your hair and body thoroughly to remove the shampoo.  Use CHG Soap as you would any other liquid soap. You can apply CHG directly to the skin and wash gently with a  scrungie or a clean washcloth.   Apply the CHG Soap to your body ONLY FROM THE NECK DOWN.  Do not use on open wounds or open sores. Avoid contact with your eyes, ears, mouth and genitals (private parts). Wash Face and genitals (private parts)  with your normal soap.   Wash thoroughly, paying special attention to the area where your surgery will be performed.  Thoroughly rinse your body with warm water from the neck down.  DO NOT shower/wash with your normal soap after using and rinsing off the CHG Soap.  Pat yourself dry with a CLEAN TOWEL.  Wear CLEAN PAJAMAS to bed the night before surgery  Place CLEAN SHEETS on your bed the night before your surgery  DO NOT SLEEP WITH PETS.   Day of Surgery: Take a shower with CHG soap. Do not wear jewelry or makeup Do not wear lotions, powders, perfumes/colognes, or deodorant. Do not shave 48 hours prior to surgery.  Men may shave face and neck. Do not bring valuables to the hospital.  [facility] is not responsible for any belongings or valuables. Do not wear nail polish, gel polish, artificial nails, or any other type of covering on natural nails (fingers and toes) If you have artificial nails or gel coating that need to be removed by a nail salon, please have this removed prior to surgery. Artificial nails or gel coating may interfere with anesthesia's ability to adequately monitor your vital signs.  Wear Clean/Comfortable clothing the morning of surgery Remember to brush your teeth WITH YOUR REGULAR TOOTHPASTE.   Please read over the following fact sheets that you were given.    If you received a COVID test during your pre-op visit  it is requested that you wear a mask when out in public, stay away from anyone that may not be feeling well and notify your surgeon if you develop symptoms. If you have been in contact with anyone that has tested positive in the last 10 days please notify you surgeon.

## 2022-11-28 ENCOUNTER — Encounter (HOSPITAL_COMMUNITY): Payer: Self-pay

## 2022-11-28 ENCOUNTER — Ambulatory Visit: Payer: Self-pay | Admitting: Surgery

## 2022-11-28 ENCOUNTER — Encounter (HOSPITAL_COMMUNITY)
Admission: RE | Admit: 2022-11-28 | Discharge: 2022-11-28 | Disposition: A | Discharge status: Home / Self Care | Payer: Medicare Other | Source: Ambulatory Visit | Attending: Surgery | Admitting: Surgery

## 2022-11-28 ENCOUNTER — Other Ambulatory Visit: Payer: Self-pay

## 2022-11-28 VITALS — BP 139/73 | HR 65 | Temp 98.5°F | Resp 17 | Ht 70.0 in | Wt 293.0 lb

## 2022-11-28 DIAGNOSIS — Z01818 Encounter for other preprocedural examination: Secondary | ICD-10-CM

## 2022-11-28 DIAGNOSIS — I10 Essential (primary) hypertension: Secondary | ICD-10-CM | POA: Insufficient documentation

## 2022-11-28 DIAGNOSIS — I253 Aneurysm of heart: Secondary | ICD-10-CM | POA: Diagnosis not present

## 2022-11-28 DIAGNOSIS — G4733 Obstructive sleep apnea (adult) (pediatric): Secondary | ICD-10-CM | POA: Diagnosis not present

## 2022-11-28 DIAGNOSIS — Z8673 Personal history of transient ischemic attack (TIA), and cerebral infarction without residual deficits: Secondary | ICD-10-CM | POA: Diagnosis not present

## 2022-11-28 DIAGNOSIS — Z01812 Encounter for preprocedural laboratory examination: Secondary | ICD-10-CM | POA: Insufficient documentation

## 2022-11-28 DIAGNOSIS — Q2112 Patent foramen ovale: Secondary | ICD-10-CM | POA: Diagnosis not present

## 2022-11-28 HISTORY — DX: Secondary polycythemia: D75.1

## 2022-11-28 HISTORY — DX: Personal history of other diseases of the digestive system: Z87.19

## 2022-11-28 HISTORY — DX: Cerebral infarction, unspecified: I63.9

## 2022-11-28 LAB — CBC
HCT: 47.8 % (ref 39.0–52.0)
Hemoglobin: 15.4 g/dL (ref 13.0–17.0)
MCH: 30.3 pg (ref 26.0–34.0)
MCHC: 32.2 g/dL (ref 30.0–36.0)
MCV: 94.1 fL (ref 80.0–100.0)
Platelets: 382 10*3/uL (ref 150–400)
RBC: 5.08 MIL/uL (ref 4.22–5.81)
RDW: 17.2 % — ABNORMAL HIGH (ref 11.5–15.5)
WBC: 9 10*3/uL (ref 4.0–10.5)
nRBC: 0 % (ref 0.0–0.2)

## 2022-11-28 LAB — BASIC METABOLIC PANEL
Anion gap: 7 (ref 5–15)
BUN: 14 mg/dL (ref 8–23)
CO2: 26 mmol/L (ref 22–32)
Calcium: 8.9 mg/dL (ref 8.9–10.3)
Chloride: 106 mmol/L (ref 98–111)
Creatinine, Ser: 1.16 mg/dL (ref 0.61–1.24)
GFR, Estimated: >60 mL/min (ref >60)
Glucose, Bld: 136 mg/dL — ABNORMAL HIGH (ref 70–99)
Potassium: 4 mmol/L (ref 3.5–5.1)
Sodium: 139 mmol/L (ref 135–145)

## 2022-11-28 NOTE — H&P (View-Only) (Signed)
Subjective  Chief Complaint: New Consultation ( Small umbilical hernia)   History of Present Illness: Paul Hinton is a 66 y.o. male who is seen today as an office consultation at the request of Dr. Raju for evaluation of New Consultation ( Small umbilical hernia) .  This is a 66-year-old male who presents with several months of a slowly enlarging umbilical hernia. It remains reducible. He denies any GI obstructive symptoms. The patient is on Plavix and aspirin due to a PFO. His cardiologist told him that he is able to stop the Plavix whenever he wishes. The patient is also undergoing evaluation for possible spinal stenosis in his cervical spine. He may need to have neck surgery in the near future.  He has a left paramedian incision from a splenectomy in 1972 Review of Systems: A complete review of systems was obtained from the patient. I have reviewed this information and discussed as appropriate with the patient. See HPI as well for other ROS.  Review of Systems Constitutional: Negative. HENT: Negative. Eyes: Negative. Respiratory: Negative. Cardiovascular: Negative. Gastrointestinal: Negative. Genitourinary: Negative. Musculoskeletal: Negative. Skin: Negative. Neurological: Negative. Endo/Heme/Allergies: Negative. Psychiatric/Behavioral: Negative.   Medical History: Past Medical History: Diagnosis Date History of stroke Hyperlipidemia Sleep apnea  There is no problem list on file for this patient.  Past Surgical History: Procedure Laterality Date Asplenic 1972 Cervical Mylopathy 2021 Discectomy Surgery 2010 PFO Closure 2023   Allergies Allergen Reactions Demerol [Meperidine] Other (See Comments) Severe Hallucinations Penicillin Unknown  Current Outpatient Medications on File Prior to Visit Medication Sig Dispense Refill anastrozole (ARIMIDEX) 1 mg tablet TAKE ONE TABLET BY MOUTH ON mondays, wednesdays, AND fridays aspirin 81 MG EC tablet atorvastatin  (LIPITOR) 40 MG tablet Take 40 mg by mouth once daily cholecalciferol (VITAMIN D3) 1000 unit tablet Take 1,000 Units by mouth clopidogreL (PLAVIX) 75 mg tablet fexofenadine (ALLEGRA) 60 MG tablet Take 60 mg by mouth once daily  No current facility-administered medications on file prior to visit.  History reviewed. No pertinent family history.  Social History  Tobacco Use Smoking Status Former Types: Cigarettes Smokeless Tobacco Never   Social History  Socioeconomic History Marital status: Married Tobacco Use Smoking status: Former Types: Cigarettes Smokeless tobacco: Never Vaping Use Vaping status: Never Used Substance and Sexual Activity Alcohol use: Yes Drug use: Never  Social Determinants of Health  Received from [facility]  Objective:  Vitals: 11/15/22 1126 BP: 131/81 Pulse: 72 Temp: 36.7 C (98 F) SpO2: 92% Weight: (!) 135 kg (297 lb 9.6 oz) Height: 177.8 cm (5' 10") PainSc: 0-No pain  Body mass index is 42.7 kg/m.  Physical Exam  Constitutional: WDWN in NAD, conversant, no obvious deformities; lying in bed comfortably Eyes: Pupils equal, round; sclera anicteric; moist conjunctiva; no lid lag HENT: Oral mucosa moist; good dentition Neck: No masses palpated, trachea midline; no thyromegaly Lungs: CTA bilaterally; normal respiratory effort CV: Regular rate and rhythm; no murmurs; extremities well-perfused with no edema Abd: +bowel sounds, soft, non-tender, no palpable organomegaly; healed left paramedian incision with no sign of ventral incisional hernia. He has a palpable umbilical hernia at the upper edge of the umbilicus. The hernia sac measures approximately 3 cm in diameter. The fascial defect is about 2 cm. Musc: Normal gait; no apparent clubbing or cyanosis in extremities Lymphatic: No palpable cervical or axillary lymphadenopathy Skin: Warm, dry; no sign of jaundice Psychiatric - alert and oriented x 4; calm mood and  affect  Assessment and Plan: Diagnoses and all orders for this   visit:  Umbilical hernia without obstruction or gangrene    Recommend umbilical hernia repair with mesh.  The surgical procedure has been discussed with the patient. Potential risks, benefits, alternative treatments, and expected outcomes have been explained. All of the patient's questions at this time have been answered. The likelihood of reaching the patient's treatment goal is good. The patient understand the proposed surgical procedure and wishes to proceed.   Avant Printy K. Meliana Canner, MD, FACS [facility]  General Surgery   11/28/2022 8:47 AM  

## 2022-11-28 NOTE — Progress Notes (Signed)
PCP - Dr. Hillard Danker Cardiologist - Dr. Rachelle Hora Croitoru  PPM/ICD - denies   Chest x-ray - 06/13/15 (Pt said he had one in on 11/18/22 at [facility] due to bronchitis).  EKG - 02/01/22 Stress Test - denies ECHO - 02/18/22 Cardiac Cath - 02/18/22  Sleep Study - OSA+ BiPAP nightly  DM- denies  ASA/Blood Thinner Instructions: Hold Plavix and ASA 5-7 days. Last dose Plavix 5/2 (Pt states he is now done with Plavix and should not have to take it anymore).    ERAS Protcol - yes PRE-SURGERY Ensure given at PAT  COVID TEST- n/a   Anesthesia review: yes, cardiac hx  Patient denies shortness of breath, fever, cough and chest pain at PAT appointment   All instructions explained to the patient, with a verbal understanding of the material. Patient agrees to go over the instructions while at home for a better understanding.  The opportunity to ask questions was provided.

## 2022-11-28 NOTE — H&P (Signed)
Subjective  Chief Complaint: New Consultation ( Small umbilical hernia)   History of Present Illness: Paul Hinton is a 67 y.o. male who is seen today as an office consultation at the request of Dr. Margaretann Loveless for evaluation of New Consultation ( Small umbilical hernia) .  This is a 67 year old male who presents with several months of a slowly enlarging umbilical hernia. It remains reducible. He denies any GI obstructive symptoms. The patient is on Plavix and aspirin due to a PFO. His cardiologist told him that he is able to stop the Plavix whenever he wishes. The patient is also undergoing evaluation for possible spinal stenosis in his cervical spine. He may need to have neck surgery in the near future.  He has a left paramedian incision from a splenectomy in 1972 Review of Systems: A complete review of systems was obtained from the patient. I have reviewed this information and discussed as appropriate with the patient. See HPI as well for other ROS.  Review of Systems Constitutional: Negative. HENT: Negative. Eyes: Negative. Respiratory: Negative. Cardiovascular: Negative. Gastrointestinal: Negative. Genitourinary: Negative. Musculoskeletal: Negative. Skin: Negative. Neurological: Negative. Endo/Heme/Allergies: Negative. Psychiatric/Behavioral: Negative.   Medical History: Past Medical History: Diagnosis Date History of stroke Hyperlipidemia Sleep apnea  There is no problem list on file for this patient.  Past Surgical History: Procedure Laterality Date Asplenic 1972 Cervical Mylopathy 2021 Discectomy Surgery 2010 PFO Closure 2023   Allergies Allergen Reactions Demerol [Meperidine] Other (See Comments) Severe Hallucinations Penicillin Unknown  Current Outpatient Medications on File Prior to Visit Medication Sig Dispense Refill anastrozole (ARIMIDEX) 1 mg tablet TAKE ONE TABLET BY MOUTH ON mondays, wednesdays, AND fridays aspirin 81 MG EC tablet atorvastatin  (LIPITOR) 40 MG tablet Take 40 mg by mouth once daily cholecalciferol (VITAMIN D3) 1000 unit tablet Take 1,000 Units by mouth clopidogreL (PLAVIX) 75 mg tablet fexofenadine (ALLEGRA) 60 MG tablet Take 60 mg by mouth once daily  No current facility-administered medications on file prior to visit.  History reviewed. No pertinent family history.  Social History  Tobacco Use Smoking Status Former Types: Cigarettes Smokeless Tobacco Never   Social History  Socioeconomic History Marital status: Married Tobacco Use Smoking status: Former Types: Cigarettes Smokeless tobacco: Never Vaping Use Vaping status: Never Used Substance and Sexual Activity Alcohol use: Yes Drug use: Never  Social Determinants of Health  Received from [employer] [facility]  Objective:  Vitals: 11/15/22 1126 BP: 131/81 Pulse: 72 Temp: 36.7 C (98 F) SpO2: 92% Weight: (!) 135 kg (297 lb 9.6 oz) Height: 177.8 cm (5\' 10" ) PainSc: 0-No pain  Body mass index is 42.7 kg/m.  Physical Exam  Constitutional: WDWN in NAD, conversant, no obvious deformities; lying in bed comfortably Eyes: Pupils equal, round; sclera anicteric; moist conjunctiva; no lid lag HENT: Oral mucosa moist; good dentition Neck: No masses palpated, trachea midline; no thyromegaly Lungs: CTA bilaterally; normal respiratory effort CV: Regular rate and rhythm; no murmurs; extremities well-perfused with no edema Abd: +bowel sounds, soft, non-tender, no palpable organomegaly; healed left paramedian incision with no sign of ventral incisional hernia. He has a palpable umbilical hernia at the upper edge of the umbilicus. The hernia sac measures approximately 3 cm in diameter. The fascial defect is about 2 cm. Musc: Normal gait; no apparent clubbing or cyanosis in extremities Lymphatic: No palpable cervical or axillary lymphadenopathy Skin: Warm, dry; no sign of jaundice Psychiatric - alert and oriented x 4; calm mood and  affect  Assessment and Plan: Diagnoses and all orders for this  visit:  Umbilical hernia without obstruction or gangrene    Recommend umbilical hernia repair with mesh.  The surgical procedure has been discussed with the patient. Potential risks, benefits, alternative treatments, and expected outcomes have been explained. All of the patient's questions at this time have been answered. The likelihood of reaching the patient's treatment goal is good. The patient understand the proposed surgical procedure and wishes to proceed.   Wilmon Arms. Corliss Skains, MD, [facility]  General Surgery   11/28/2022 8:47 AM

## 2022-11-29 NOTE — Anesthesia Preprocedure Evaluation (Addendum)
Anesthesia Evaluation  Patient identified by MRN, date of birth, ID band Patient awake    Reviewed: Allergy & Precautions, NPO status , Patient's Chart, lab work & pertinent test results  Airway Mallampati: III  TM Distance: >3 FB Neck ROM: Full    Dental no notable dental hx. (+) Dental Advisory Given   Pulmonary sleep apnea , former smoker   Pulmonary exam normal        Cardiovascular negative cardio ROS Normal cardiovascular exam  PFO on echo 11/2021     Neuro/Psych  PSYCHIATRIC DISORDERS Anxiety Depression     stroke 11/2021 CVA    GI/Hepatic negative GI ROS, Neg liver ROS,,,  Endo/Other  negative endocrine ROS    Renal/GU negative Renal ROS     Musculoskeletal negative musculoskeletal ROS (+)    Abdominal   Peds  Hematology negative hematology ROS (+)   Anesthesia Other Findings   Reproductive/Obstetrics                              Anesthesia Physical Anesthesia Plan  ASA: 3  Anesthesia Plan: General   Post-op Pain Management: Tylenol PO (pre-op)* and Celebrex PO (pre-op)*   Induction: Intravenous  PONV Risk Score and Plan: 1 and Propofol infusion  Airway Management Planned: Oral ETT  Additional Equipment: None  Intra-op Plan:   Post-operative Plan: Extubation in OR  Informed Consent:      Dental advisory given  Plan Discussed with: Anesthesiologist and CRNA  Anesthesia Plan Comments: (PAT note by Antionette Poles, PA-C: Follows with cardiology for hx of  hypertension, OSA on BiPAP, polycythemia, and CVA found to have a PFO during stroke workup. He ultimately underwent PFO closure 02/18/22 with a 25 mm Talisman PFO occluder using ICE and fluoroscopic guidance with recommendations to continue ASA and Plavix x 6 months along with SBE prophylaxis for 6 months after implant. Seen by Jari Favre 11/21/22 for preop eval. Per note, "Mr. Kane's perioperative risk of a major  cardiac event is 6.6% according to the Revised Cardiac Risk Index (RCRI).  Therefore, he is at high risk for perioperative complications.   His functional capacity is fair at 4.95 METs according to the Duke Activity Status Index (DASI). Recommendations: According to ACC/AHA guidelines, no further cardiovascular testing needed.  The patient may proceed to surgery at acceptable risk.  Antiplatelet and/or Anticoagulation Recommendations: The patient should remain on Aspirin without interruption."  I confirmed with the patient that he will continue taking 81mg  ASA through surgery as recommended by cardiology.  I also spoke with the patient about a recent reported episode of bronchitis. He states he returned from [place] on 11/18/22 with a cough and was subsequently seen at urgent care on 5/7 and diagnosed with bronchitis and treated with a steroid taper and azithromycin. He states the symptoms improved rapidly and reports no respiratory symptoms since approximately 11/25/22. He denies any cough, states he feels well overall. Given that he will have > 2 weeks of symptom resolution by time of surgery, advised he can proceed as planned barring any status change.   Preop labs reviewed, unremarkable.   EKG 02/01/22: NSR. Rate 61.  Limited echo 02/18/22 post PFO closure:  1. Left ventricular ejection fraction, by estimation, is 65 to 70%. The  left ventricle has normal function.   2. 25 mm Talisman PFO occluder is well seated with no shunting by color  flow Doppler.   3. The mitral valve is  normal in structure.   TEE 01/21/22:  1. Evidence of atrial level shunting detected by color flow Doppler.  There is a large patent foramen ovale with predominantly right to left  shunting across the atrial septum.   2. Left ventricular ejection fraction, by estimation, is 60 to 65%. The  left ventricle has normal function. The left ventricle has no regional  wall motion abnormalities.   3. Right ventricular systolic  function is normal. The right ventricular  size is normal.   4. No left atrial/left atrial appendage thrombus was detected. The LAA  emptying velocity was 76 cm/s.   5. The mitral valve is grossly normal. Trivial mitral valve  regurgitation. No evidence of mitral stenosis.   6. The aortic valve is tricuspid. Aortic valve regurgitation is not  visualized. No aortic stenosis is present.   )        Anesthesia Quick Evaluation

## 2022-11-29 NOTE — Progress Notes (Signed)
Anesthesia Chart Review:  Follows with cardiology for hx of  hypertension, OSA on BiPAP, polycythemia, and CVA found to have a PFO during stroke workup. He ultimately underwent PFO closure 02/18/22 with a 25 mm Talisman PFO occluder using ICE and fluoroscopic guidance with recommendations to continue ASA and Plavix x 6 months along with SBE prophylaxis for 6 months after implant. Seen by Jari Favre 11/21/22 for preop eval. Per note, "Mr. Cancro's perioperative risk of a major cardiac event is 6.6% according to the Revised Cardiac Risk Index (RCRI).  Therefore, he is at high risk for perioperative complications.   His functional capacity is fair at 4.95 METs according to the Duke Activity Status Index (DASI). Recommendations: According to ACC/AHA guidelines, no further cardiovascular testing needed.  The patient may proceed to surgery at acceptable risk.  Antiplatelet and/or Anticoagulation Recommendations: The patient should remain on Aspirin without interruption."  Preop labs reviewed, unremarkable.   EKG 02/01/22: NSR. Rate 61.  Limited echo 02/18/22 post PFO closure:  1. Left ventricular ejection fraction, by estimation, is 65 to 70%. The  left ventricle has normal function.   2. 25 mm Talisman PFO occluder is well seated with no shunting by color  flow Doppler.   3. The mitral valve is normal in structure.   TEE 01/21/22:  1. Evidence of atrial level shunting detected by color flow Doppler.  There is a large patent foramen ovale with predominantly right to left  shunting across the atrial septum.   2. Left ventricular ejection fraction, by estimation, is 60 to 65%. The  left ventricle has normal function. The left ventricle has no regional  wall motion abnormalities.   3. Right ventricular systolic function is normal. The right ventricular  size is normal.   4. No left atrial/left atrial appendage thrombus was detected. The LAA  emptying velocity was 76 cm/s.   5. The mitral valve is grossly  normal. Trivial mitral valve  regurgitation. No evidence of mitral stenosis.   6. The aortic valve is tricuspid. Aortic valve regurgitation is not  visualized. No aortic stenosis is present.     Zannie Cove [facility] Stay [facility]/Anesthesiology Phone [phone] 11/29/2022 2:19 PM

## 2022-12-03 ENCOUNTER — Other Ambulatory Visit: Payer: Self-pay | Admitting: Cardiology

## 2022-12-03 DIAGNOSIS — I253 Aneurysm of heart: Secondary | ICD-10-CM

## 2022-12-10 ENCOUNTER — Encounter (HOSPITAL_COMMUNITY)
Admission: RE | Disposition: A | Discharge status: Home / Self Care | Payer: Self-pay | Source: Home / Self Care | Attending: Surgery

## 2022-12-10 ENCOUNTER — Ambulatory Visit (HOSPITAL_BASED_OUTPATIENT_CLINIC_OR_DEPARTMENT_OTHER): Payer: Medicare Other | Admitting: Anesthesiology

## 2022-12-10 ENCOUNTER — Ambulatory Visit (HOSPITAL_COMMUNITY): Payer: Medicare Other | Admitting: Physician Assistant

## 2022-12-10 ENCOUNTER — Ambulatory Visit (HOSPITAL_COMMUNITY)
Admission: RE | Admit: 2022-12-10 | Discharge: 2022-12-10 | Disposition: A | Discharge status: Home / Self Care | Payer: Medicare Other | Attending: Surgery | Admitting: Surgery

## 2022-12-10 ENCOUNTER — Other Ambulatory Visit: Payer: Self-pay

## 2022-12-10 ENCOUNTER — Encounter (HOSPITAL_COMMUNITY): Payer: Self-pay | Admitting: Surgery

## 2022-12-10 DIAGNOSIS — Z7902 Long term (current) use of antithrombotics/antiplatelets: Secondary | ICD-10-CM | POA: Diagnosis not present

## 2022-12-10 DIAGNOSIS — F418 Other specified anxiety disorders: Secondary | ICD-10-CM

## 2022-12-10 DIAGNOSIS — G4730 Sleep apnea, unspecified: Secondary | ICD-10-CM

## 2022-12-10 DIAGNOSIS — Z87891 Personal history of nicotine dependence: Secondary | ICD-10-CM | POA: Insufficient documentation

## 2022-12-10 DIAGNOSIS — K429 Umbilical hernia without obstruction or gangrene: Secondary | ICD-10-CM | POA: Diagnosis not present

## 2022-12-10 DIAGNOSIS — Z7982 Long term (current) use of aspirin: Secondary | ICD-10-CM | POA: Diagnosis not present

## 2022-12-10 DIAGNOSIS — Z09 Encounter for follow-up examination after completed treatment for conditions other than malignant neoplasm: Secondary | ICD-10-CM | POA: Diagnosis not present

## 2022-12-10 DIAGNOSIS — Z9081 Acquired absence of spleen: Secondary | ICD-10-CM | POA: Insufficient documentation

## 2022-12-10 DIAGNOSIS — Z8774 Personal history of (corrected) congenital malformations of heart and circulatory system: Secondary | ICD-10-CM | POA: Insufficient documentation

## 2022-12-10 DIAGNOSIS — Z8673 Personal history of transient ischemic attack (TIA), and cerebral infarction without residual deficits: Secondary | ICD-10-CM | POA: Diagnosis not present

## 2022-12-10 HISTORY — PX: INSERTION OF MESH: SHX5868

## 2022-12-10 HISTORY — PX: UMBILICAL HERNIA REPAIR: SHX196

## 2022-12-10 SURGERY — REPAIR, HERNIA, UMBILICAL, ADULT
Anesthesia: General | Site: Abdomen

## 2022-12-10 MED ORDER — FENTANYL CITRATE (PF) 100 MCG/2ML IJ SOLN
INTRAMUSCULAR | Status: DC | PRN
  Administered 2022-12-10: 25 ug via INTRAVENOUS
  Administered 2022-12-10: 50 ug via INTRAVENOUS

## 2022-12-10 MED ORDER — OXYCODONE HCL 5 MG PO TABS: 5.0000 mg | ORAL_TABLET | Freq: Four times a day (QID) | ORAL | 0 refills | Status: DC | PRN

## 2022-12-10 MED ORDER — CELECOXIB 200 MG PO CAPS
ORAL_CAPSULE | ORAL | Status: AC
  Administered 2022-12-10: 200 mg via ORAL
  Filled 2022-12-10: qty 1

## 2022-12-10 MED ORDER — MEPERIDINE HCL 25 MG/ML IJ SOLN: 6.2500 mg | INTRAMUSCULAR | Status: DC | PRN

## 2022-12-10 MED ORDER — 0.9 % SODIUM CHLORIDE (POUR BTL) OPTIME
TOPICAL | Status: DC | PRN
  Administered 2022-12-10: 1000 mL

## 2022-12-10 MED ORDER — ACETAMINOPHEN 325 MG PO TABS: 325.0000 mg | ORAL_TABLET | ORAL | Status: DC | PRN

## 2022-12-10 MED ORDER — CHLORHEXIDINE GLUCONATE CLOTH 2 % EX PADS: 6.0000 | MEDICATED_PAD | Freq: Once | CUTANEOUS | Status: DC

## 2022-12-10 MED ORDER — FENTANYL CITRATE (PF) 100 MCG/2ML IJ SOLN: 25.0000 ug | INTRAMUSCULAR | Status: DC | PRN

## 2022-12-10 MED ORDER — BUPIVACAINE-EPINEPHRINE (PF) 0.25% -1:200000 IJ SOLN
INTRAMUSCULAR | Status: AC
  Filled 2022-12-10: qty 30

## 2022-12-10 MED ORDER — DEXAMETHASONE SODIUM PHOSPHATE 10 MG/ML IJ SOLN
INTRAMUSCULAR | Status: AC
  Filled 2022-12-10: qty 1

## 2022-12-10 MED ORDER — BUPIVACAINE-EPINEPHRINE 0.25% -1:200000 IJ SOLN
INTRAMUSCULAR | Status: DC | PRN
  Administered 2022-12-10: 10 mL

## 2022-12-10 MED ORDER — LIDOCAINE 2% (20 MG/ML) 5 ML SYRINGE
INTRAMUSCULAR | Status: DC | PRN
  Administered 2022-12-10: 100 mg via INTRAVENOUS

## 2022-12-10 MED ORDER — PROPOFOL 10 MG/ML IV BOLUS
INTRAVENOUS | Status: DC | PRN
  Administered 2022-12-10: 160 mg via INTRAVENOUS

## 2022-12-10 MED ORDER — OXYCODONE HCL 5 MG PO TABS: 5.0000 mg | ORAL_TABLET | Freq: Once | ORAL | Status: DC | PRN

## 2022-12-10 MED ORDER — SUGAMMADEX SODIUM 200 MG/2ML IV SOLN
INTRAVENOUS | Status: DC | PRN
  Administered 2022-12-10: 250 mg via INTRAVENOUS
  Administered 2022-12-10: 100 mg via INTRAVENOUS

## 2022-12-10 MED ORDER — LACTATED RINGERS IV SOLN: INTRAVENOUS | Status: DC

## 2022-12-10 MED ORDER — FENTANYL CITRATE (PF) 250 MCG/5ML IJ SOLN
INTRAMUSCULAR | Status: AC
  Filled 2022-12-10: qty 5

## 2022-12-10 MED ORDER — ACETAMINOPHEN 500 MG PO TABS
1000.0000 mg | ORAL_TABLET | Freq: Once | ORAL | Status: AC
  Administered 2022-12-10: 1000 mg via ORAL

## 2022-12-10 MED ORDER — CELECOXIB 200 MG PO CAPS: 200.0000 mg | ORAL_CAPSULE | Freq: Once | ORAL | Status: AC

## 2022-12-10 MED ORDER — CEFAZOLIN IN SODIUM CHLORIDE 3-0.9 GM/100ML-% IV SOLN
3.0000 g | INTRAVENOUS | Status: AC
  Administered 2022-12-10: 3 g via INTRAVENOUS
  Filled 2022-12-10: qty 100

## 2022-12-10 MED ORDER — ONDANSETRON HCL 4 MG/2ML IJ SOLN: 4.0000 mg | Freq: Once | INTRAMUSCULAR | Status: DC | PRN

## 2022-12-10 MED ORDER — ONDANSETRON HCL 4 MG/2ML IJ SOLN
INTRAMUSCULAR | Status: AC
  Filled 2022-12-10: qty 2

## 2022-12-10 MED ORDER — ROCURONIUM BROMIDE 100 MG/10ML IV SOLN
INTRAVENOUS | Status: DC | PRN
  Administered 2022-12-10: 50 mg via INTRAVENOUS

## 2022-12-10 MED ORDER — LIDOCAINE 2% (20 MG/ML) 5 ML SYRINGE
INTRAMUSCULAR | Status: AC
  Filled 2022-12-10: qty 5

## 2022-12-10 MED ORDER — ORAL CARE MOUTH RINSE: 15.0000 mL | Freq: Once | OROMUCOSAL | Status: AC

## 2022-12-10 MED ORDER — MIDAZOLAM HCL 2 MG/2ML IJ SOLN
INTRAMUSCULAR | Status: AC
  Filled 2022-12-10: qty 2

## 2022-12-10 MED ORDER — ACETAMINOPHEN 500 MG PO TABS
1000.0000 mg | ORAL_TABLET | ORAL | Status: DC
  Filled 2022-12-10: qty 2

## 2022-12-10 MED ORDER — PROPOFOL 10 MG/ML IV BOLUS
INTRAVENOUS | Status: AC
  Filled 2022-12-10: qty 20

## 2022-12-10 MED ORDER — OXYCODONE HCL 5 MG/5ML PO SOLN: 5.0000 mg | Freq: Once | ORAL | Status: DC | PRN

## 2022-12-10 MED ORDER — MIDAZOLAM HCL 5 MG/5ML IJ SOLN
INTRAMUSCULAR | Status: DC | PRN
  Administered 2022-12-10: 2 mg via INTRAVENOUS

## 2022-12-10 MED ORDER — ALBUTEROL SULFATE HFA 108 (90 BASE) MCG/ACT IN AERS
INHALATION_SPRAY | RESPIRATORY_TRACT | Status: DC | PRN
  Administered 2022-12-10: 4 via RESPIRATORY_TRACT

## 2022-12-10 MED ORDER — DEXAMETHASONE SODIUM PHOSPHATE 10 MG/ML IJ SOLN
INTRAMUSCULAR | Status: DC | PRN
  Administered 2022-12-10: 10 mg via INTRAVENOUS

## 2022-12-10 MED ORDER — ROCURONIUM BROMIDE 10 MG/ML (PF) SYRINGE
PREFILLED_SYRINGE | INTRAVENOUS | Status: AC
  Filled 2022-12-10: qty 10

## 2022-12-10 MED ORDER — ONDANSETRON HCL 4 MG/2ML IJ SOLN
INTRAMUSCULAR | Status: DC | PRN
  Administered 2022-12-10: 4 mg via INTRAVENOUS

## 2022-12-10 MED ORDER — CHLORHEXIDINE GLUCONATE 0.12 % MT SOLN
15.0000 mL | Freq: Once | OROMUCOSAL | Status: AC
  Administered 2022-12-10: 15 mL via OROMUCOSAL
  Filled 2022-12-10: qty 15

## 2022-12-10 MED ORDER — PROPOFOL 500 MG/50ML IV EMUL
INTRAVENOUS | Status: DC | PRN
  Administered 2022-12-10: 150 ug/kg/min via INTRAVENOUS

## 2022-12-10 MED ORDER — ACETAMINOPHEN 160 MG/5ML PO SOLN: 325.0000 mg | ORAL | Status: DC | PRN

## 2022-12-10 SURGICAL SUPPLY — 38 items
APL PRP STRL LF DISP 70% ISPRP (MISCELLANEOUS) ×2
APL SKNCLS STERI-STRIP NONHPOA (GAUZE/BANDAGES/DRESSINGS) ×2
BAG COUNTER SPONGE SURGICOUNT (BAG) ×2 IMPLANT
BAG SPNG CNTER NS LX DISP (BAG) ×2
BENZOIN TINCTURE PRP APPL 2/3 (GAUZE/BANDAGES/DRESSINGS) ×2 IMPLANT
BLADE CLIPPER SURG (BLADE) IMPLANT
CANISTER SUCT 3000ML PPV (MISCELLANEOUS) IMPLANT
CHLORAPREP W/TINT 26 (MISCELLANEOUS) ×2 IMPLANT
COVER SURGICAL LIGHT HANDLE (MISCELLANEOUS) ×2 IMPLANT
DRAPE LAPAROTOMY 100X72 PEDS (DRAPES) ×2 IMPLANT
DRSG TEGADERM 4X4.75 (GAUZE/BANDAGES/DRESSINGS) ×2 IMPLANT
ELECT CAUTERY BLADE 6.4 (BLADE) IMPLANT
ELECT REM PT RETURN 9FT ADLT (ELECTROSURGICAL) ×2
ELECTRODE REM PT RTRN 9FT ADLT (ELECTROSURGICAL) ×2 IMPLANT
GAUZE 4X4 16PLY ~~LOC~~+RFID DBL (SPONGE) ×2 IMPLANT
GAUZE SPONGE 2X2 8PLY STRL LF (GAUZE/BANDAGES/DRESSINGS) ×2 IMPLANT
GLOVE BIO SURGEON STRL SZ7 (GLOVE) ×2 IMPLANT
GLOVE BIOGEL PI IND STRL 7.5 (GLOVE) ×2 IMPLANT
GOWN STRL REUS W/ TWL LRG LVL3 (GOWN DISPOSABLE) ×4 IMPLANT
GOWN STRL REUS W/TWL LRG LVL3 (GOWN DISPOSABLE) ×4
KIT BASIN OR (CUSTOM PROCEDURE TRAY) ×2 IMPLANT
KIT TURNOVER KIT B (KITS) ×2 IMPLANT
MESH VENTRALEX ST 1-7/10 CRC S (Mesh General) IMPLANT
NDL HYPO 25GX1X1/2 BEV (NEEDLE) ×2 IMPLANT
NEEDLE HYPO 25GX1X1/2 BEV (NEEDLE) ×2 IMPLANT
NS IRRIG 1000ML POUR BTL (IV SOLUTION) ×2 IMPLANT
PACK GENERAL/GYN (CUSTOM PROCEDURE TRAY) ×2 IMPLANT
PAD ARMBOARD 7.5X6 YLW CONV (MISCELLANEOUS) ×2 IMPLANT
PENCIL SMOKE EVACUATOR (MISCELLANEOUS) ×2 IMPLANT
STRIP CLOSURE SKIN 1/2X4 (GAUZE/BANDAGES/DRESSINGS) ×2 IMPLANT
SUT MNCRL AB 4-0 PS2 18 (SUTURE) ×2 IMPLANT
SUT NOVA NAB DX-16 0-1 5-0 T12 (SUTURE) ×2 IMPLANT
SUT NOVA NAB GS-21 0 18 T12 DT (SUTURE) ×2 IMPLANT
SUT VIC AB 3-0 SH 27 (SUTURE) ×2
SUT VIC AB 3-0 SH 27X BRD (SUTURE) ×2 IMPLANT
SYR CONTROL 10ML LL (SYRINGE) ×2 IMPLANT
TOWEL GREEN STERILE (TOWEL DISPOSABLE) ×2 IMPLANT
TOWEL GREEN STERILE FF (TOWEL DISPOSABLE) ×2 IMPLANT

## 2022-12-10 NOTE — Transfer of Care (Signed)
Immediate Anesthesia Transfer of Care Note  Patient: Paul Hinton  Procedure(s) Performed: HERNIA REPAIR UMBILICAL ADULT WITH MESH (Abdomen) INSERTION OF MESH (Abdomen)  Patient Location: PACU  Anesthesia Type:General  Level of Consciousness: drowsy and patient cooperative  Airway & Oxygen Therapy: Patient Spontanous Breathing and Patient connected to face mask oxygen  Post-op Assessment: Report given to RN and Post -op Vital signs reviewed and stable  Post vital signs: Reviewed and stable  Last Vitals:  Vitals Value Taken Time  BP 151/92 12/10/22 1139  Temp    Pulse 72 12/10/22 1143  Resp 13 12/10/22 1143  SpO2 94 % 12/10/22 1143  Vitals shown include unvalidated device data.  Last Pain:  Vitals:   12/10/22 0924  TempSrc: Oral  PainSc:          Complications: No notable events documented.

## 2022-12-10 NOTE — Interval H&P Note (Signed)
History and Physical Interval Note:  12/10/2022 9:44 AM  Paul Hinton  has presented today for surgery, with the diagnosis of UMBILICAL HERNIA.  The various methods of treatment have been discussed with the patient and family. After consideration of risks, benefits and other options for treatment, the patient has consented to  Procedure(s) with comments: HERNIA REPAIR UMBILICAL ADULT WITH MESH (N/A) - LMA  60 as a surgical intervention.  The patient's history has been reviewed, patient examined, no change in status, stable for surgery.  I have reviewed the patient's chart and labs.  Questions were answered to the patient's satisfaction.     [name]

## 2022-12-10 NOTE — Discharge Instructions (Signed)
[facility] _______[facility]  UMBILICAL HERNIA REPAIR: POST OP INSTRUCTIONS  Always review your discharge instruction sheet given to you by the facility where your surgery was performed. IF YOU HAVE DISABILITY OR FAMILY LEAVE FORMS, YOU MUST BRING THEM TO THE OFFICE FOR PROCESSING.   DO NOT GIVE THEM TO YOUR DOCTOR.  1. A  prescription for pain medication may be given to you upon discharge.  Take your pain medication as prescribed, if needed.  If narcotic pain medicine is not needed, then you may take acetaminophen (Tylenol) or ibuprofen (Advil) as needed. 2. Take your usually prescribed medications unless otherwise directed. If you need a refill on your pain medication, please contact your pharmacy.  They will contact our office to request authorization. Prescriptions will not be filled after 5 pm or on week-ends. 3. You should follow a light diet the first 24 hours after arrival home, such as soup and crackers, etc.  Be sure to include lots of fluids daily.  Resume your normal diet the day after surgery. 4.Most patients will experience some swelling and bruising around the umbilicus or in the groin and scrotum.  Ice packs and reclining will help.  Swelling and bruising can take several days to resolve.  6. It is common to experience some constipation if taking pain medication after surgery.  Increasing fluid intake and taking a stool softener (such as Colace) will usually help or prevent this problem from occurring.  A mild laxative (Milk of Magnesia or Miralax) should be taken according to package directions if there are no bowel movements after 48 hours. 7. Unless discharge instructions indicate otherwise, you may remove your bandages 24-48 hours after surgery, and you may shower at that time.  You may have steri-strips (small skin tapes) in place directly over the incision.  These strips should be left on the skin for 7-10 days.  If your surgeon used skin glue on the incision, you may  shower in 24 hours.  The glue will flake off over the next 2-3 weeks.  Any sutures or staples will be removed at the office during your follow-up visit. 8. ACTIVITIES:  You may resume regular (light) daily activities beginning the next day--such as daily self-care, walking, climbing stairs--gradually increasing activities as tolerated.  You may have sexual intercourse when it is comfortable.  Refrain from any heavy lifting or straining until approved by your doctor.  a.You may drive when you are no longer taking prescription pain medication, you can comfortably wear a seatbelt, and you can safely maneuver your car and apply brakes. b.RETURN TO WORK:   _____________________________________________  9.You should see your doctor in the office for a follow-up appointment approximately 2-3 weeks after your surgery.  Make sure that you call for this appointment within a day or two after you arrive home to insure a convenient appointment time. 10.OTHER INSTRUCTIONS: _________________________    _____________________________________  WHEN TO CALL YOUR DOCTOR: Fever over 101.0 Inability to urinate Nausea and/or vomiting Extreme swelling or bruising Continued bleeding from incision. Increased pain, redness, or drainage from the incision  The clinic staff is available to answer your questions during regular business hours.  Please don't hesitate to call and ask to speak to one of the nurses for clinical concerns.  If you have a medical emergency, go to the nearest emergency room or call 911.  A surgeon from [facility] is always on call at the hospital   [address], [address], [place], [place]    [address] ?  [address] [address], [place], [place]   [address] [phone] ? [phone] ? FAX [phone] Web site: [email or web address]  

## 2022-12-10 NOTE — Op Note (Signed)
Indications:  This is a 67 year old male who presents with several months of a slowly enlarging umbilical hernia. It remains reducible. He denies any GI obstructive symptoms.  The patient was examined and we recommended umbilical hernia repair with mesh.  Pre-operative diagnosis:  Umbilical hernia  Post-operative diagnosis:  Same  Procedure:  Umbilical hernia repair with mesh  Surgeon:  [name] Resident:  Dr. Evern Core I was personally present during the key and critical portions of this procedure and immediately available throughout the entire procedure, as documented in my operative note.    Procedure Details  The patient was seen again in the Holding Room. The risks, benefits, complications, treatment options, and expected outcomes were discussed with the patient. The possibilities of reaction to medication, pulmonary aspiration, perforation of viscus, bleeding, recurrent infection, the need for additional procedures, and development of a complication requiring transfusion or further operation were discussed with the patient and/or family. There was concurrence with the proposed plan, and informed consent was obtained. The site of surgery was properly noted/marked. The patient was taken to the Operating Room, identified as [name], and the procedure verified as umbilical hernia repair. A Time Out was held and the above information confirmed.  After an adequate level of general anesthesia was obtained, the patient's abdomen was prepped with Chloraprep and draped in sterile fashion.  We made a transverse incision below the umbilicus.  Dissection was carried down to the hernia sac with cautery.  We dissected bluntly around the hernia sac down to the edge of the fascial defect.  The defect is directly behind the umbilicus. We reduced the hernia sac back into the pre-peritoneal space.  The fascial defect measured 1.5 cm.  We cleared the fascia in all directions.  A small Ventralex mesh  was inserted into the pre-peritoneal space and was deployed.  The mesh was secured with four trans-fascial sutures of 0 Novofil.  The fascial defect was closed with multiple interrupted figure-of-eight 1 Novofil sutures.  The base of the umbilicus was tacked down with 3-0 Vicryl.  3-0 Vicryl was used to close the subcutaneous tissues and 4-0 Monocryl was used to close the skin.  Steri-strips and clean dressing were applied.  The patient was extubated and brought to the recovery room in stable condition.  All sponge, instrument, and needle counts were correct prior to closure and at the conclusion of the case.   Estimated Blood Loss: Minimal          Complications: None; patient tolerated the procedure well.         Disposition: PACU - hemodynamically stable.         Condition: stable  Paul Hinton. Paul Skains, MD, [facility]  General Surgery   12/10/2022 11:17 AM

## 2022-12-10 NOTE — Anesthesia Postprocedure Evaluation (Signed)
Anesthesia Post Note  Patient: Paul Hinton  Procedure(s) Performed: HERNIA REPAIR UMBILICAL ADULT WITH MESH (Abdomen) INSERTION OF MESH (Abdomen)     Patient location during evaluation: PACU Anesthesia Type: General Level of consciousness: awake and alert Pain management: pain level controlled Vital Signs Assessment: post-procedure vital signs reviewed and stable Respiratory status: spontaneous breathing, nonlabored ventilation, respiratory function stable and patient connected to nasal cannula oxygen Cardiovascular status: blood pressure returned to baseline and stable Postop Assessment: no apparent nausea or vomiting Anesthetic complications: no   No notable events documented.  Last Vitals:  Vitals:   12/10/22 1200 12/10/22 1215  BP: 123/67 134/78  Pulse: 62 (!) 51  Resp: 12 13  Temp:    SpO2: 99% 99%    Last Pain:  Vitals:   12/10/22 1200  TempSrc:   PainSc: 0-No pain                 Aloura Matsuoka

## 2022-12-10 NOTE — Anesthesia Procedure Notes (Signed)
Procedure Name: Intubation Date/Time: 12/10/2022 10:33 AM  Performed by: Marny Lowenstein, CRNAPre-anesthesia Checklist: Patient identified, Emergency Drugs available, Suction available and Patient being monitored Patient Re-evaluated:Patient Re-evaluated prior to induction Oxygen Delivery Method: Circle system utilized Preoxygenation: Pre-oxygenation with 100% oxygen Induction Type: IV induction Ventilation: Mask ventilation without difficulty Laryngoscope Size: Miller and 2 Grade View: Grade II Tube type: Oral Tube size: 7.5 mm Number of attempts: 1 Airway Equipment and Method: Patient positioned with wedge pillow and Stylet Placement Confirmation: ETT inserted through vocal cords under direct vision, positive ETCO2 and breath sounds checked- equal and bilateral Secured at: 22 cm Tube secured with: Tape Dental Injury: Teeth and Oropharynx as per pre-operative assessment

## 2022-12-12 ENCOUNTER — Telehealth: Payer: Self-pay | Admitting: *Deleted

## 2022-12-12 ENCOUNTER — Encounter (HOSPITAL_COMMUNITY): Payer: Self-pay | Admitting: Surgery

## 2022-12-12 NOTE — Telephone Encounter (Signed)
   Pre-operative Risk Assessment    Patient Name: Paul Hinton  DOB: 09-15-1955 MRN: [id number]      Request for Surgical Clearance    Procedure:   LUMBAR FUSION  Date of Surgery:  Clearance TBD                                 Surgeon:  DR. Tressie Stalker Surgeon's Group or Practice Name:  [facility] Phone number:  [phone] Fax number:  [phone] ATTN: [id number]   Type of Clearance Requested:   - Medical ; NO MEDICATIONS LISTED AS NEEDING TO BE HELD   Type of Anesthesia:  General    Additional requests/questions:    Elpidio Anis   12/12/2022, 9:47 AM

## 2022-12-12 NOTE — Telephone Encounter (Signed)
     Primary Cardiologist: Thurmon Fair, MD  Chart reviewed as part of pre-operative protocol coverage. Given past medical history and time since last visit, based on ACC/AHA guidelines, Unice Angier would be at acceptable risk for the planned procedure without further cardiovascular testing.   His RCRI is a class III risk, 6.6% risk of major cardiac event.  He is able to complete greater than 4 METS of physical activity.  Per office protocol, he may hold  aspirin for 5-7 days prior to procedure and should resume as soon as hemodynamically stable postoperatively.   I will route this recommendation to the requesting party via Epic fax function and remove from pre-op pool.  Please call with questions.  Thomasene Ripple. Chereese Cilento NP-C     12/12/2022, 10:26 AM [facility] [address] Office [phone] Fax [phone]

## 2022-12-13 ENCOUNTER — Other Ambulatory Visit: Payer: Self-pay

## 2022-12-18 ENCOUNTER — Other Ambulatory Visit: Payer: Self-pay | Admitting: Neurosurgery

## 2022-12-25 ENCOUNTER — Telehealth: Payer: Self-pay | Admitting: *Deleted

## 2022-12-25 DIAGNOSIS — C50929 Malignant neoplasm of unspecified site of unspecified male breast: Secondary | ICD-10-CM | POA: Diagnosis not present

## 2022-12-25 NOTE — Telephone Encounter (Signed)
S/w the pt and he has been scheduled for tele pre op appt 01/02/23 @ 10 am . Med rec and consent are done.     Patient Consent for Virtual Visit        Paul Hinton has provided verbal consent on 12/25/2022 for a virtual visit (video or telephone).   CONSENT FOR VIRTUAL VISIT FOR:  Paul Hinton  By participating in this virtual visit I agree to the following:  I hereby voluntarily request, consent and authorize [facility] and its employed or contracted physicians, physician assistants, nurse practitioners or other licensed health care professionals (the Practitioner), to provide me with telemedicine health care services (the "Services") as deemed necessary by the treating Practitioner. I acknowledge and consent to receive the Services by the Practitioner via telemedicine. I understand that the telemedicine visit will involve communicating with the Practitioner through live audiovisual communication technology and the disclosure of certain medical information by electronic transmission. I acknowledge that I have been given the opportunity to request an in-person assessment or other available alternative prior to the telemedicine visit and am voluntarily participating in the telemedicine visit.  I understand that I have the right to withhold or withdraw my consent to the use of telemedicine in the course of my care at any time, without affecting my right to future care or treatment, and that the Practitioner or I may terminate the telemedicine visit at any time. I understand that I have the right to inspect all information obtained and/or recorded in the course of the telemedicine visit and may receive copies of available information for a reasonable fee.  I understand that some of the potential risks of receiving the Services via telemedicine include:  Delay or interruption in medical evaluation due to technological equipment failure or disruption; Information transmitted may not be  sufficient (e.g. poor resolution of images) to allow for appropriate medical decision making by the Practitioner; and/or  In rare instances, security protocols could fail, causing a breach of personal health information.  Furthermore, I acknowledge that it is my responsibility to provide information about my medical history, conditions and care that is complete and accurate to the best of my ability. I acknowledge that Practitioner's advice, recommendations, and/or decision may be based on factors not within their control, such as incomplete or inaccurate data provided by me or distortions of diagnostic images or specimens that may result from electronic transmissions. I understand that the practice of medicine is not an exact science and that Practitioner makes no warranties or guarantees regarding treatment outcomes. I acknowledge that a copy of this consent can be made available to me via my patient portal [facility] MyChart), or I can request a printed copy by calling the office of [facility].    I understand that my insurance will be billed for this visit.   I have read or had this consent read to me. I understand the contents of this consent, which adequately explains the benefits and risks of the Services being provided via telemedicine.  I have been provided ample opportunity to ask questions regarding this consent and the Services and have had my questions answered to my satisfaction. I give my informed consent for the services to be provided through the use of telemedicine in my medical care

## 2022-12-25 NOTE — Telephone Encounter (Signed)
Primary Cardiologist:Mihai Croitoru, MD   Preoperative team, please contact this patient and set up a phone call appointment for further preoperative risk assessment. Please obtain consent and complete medication review. Thank you for your help.  Per office protocol, he may hold Plavix for 5 days and aspirin for 5-7 days prior to procedure . He was cleared to discontinue these medications in  March 2024.  Levi Aland, NP-C  12/25/2022, 11:40 AM [address] Office [phone] Fax [phone]

## 2022-12-25 NOTE — Telephone Encounter (Signed)
   Pre-operative Risk Assessment    Patient Name: Paul Hinton  DOB: 1955-11-26 MRN: [id number]      Request for Surgical Clearance    Procedure:   COLONOSCOPY  Date of Surgery:  Clearance TBD                                 Surgeon:  Liliane Shi, DO Surgeon's Group or Practice Name:  [facility] Phone number:  [phone] Fax number:  [phone]   Type of Clearance Requested:   - Medical  - Pharmacy:  Hold Aspirin NOT INDICATED    Type of Anesthesia:   PROPOFOL   Additional requests/questions:    [name]   12/25/2022, 9:58 AM

## 2022-12-25 NOTE — Telephone Encounter (Signed)
S/w the pt and he has been scheduled for tele pre op appt 01/02/23 @ 10 am . Med rec and consent are done.

## 2022-12-30 DIAGNOSIS — K429 Umbilical hernia without obstruction or gangrene: Secondary | ICD-10-CM | POA: Diagnosis not present

## 2023-01-02 ENCOUNTER — Ambulatory Visit: Payer: Medicare Other | Attending: Cardiology

## 2023-01-02 DIAGNOSIS — Z01810 Encounter for preprocedural cardiovascular examination: Secondary | ICD-10-CM
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
## 2023-01-23 DIAGNOSIS — K640 First degree hemorrhoids: Secondary | ICD-10-CM | POA: Diagnosis not present

## 2023-01-23 DIAGNOSIS — D123 Benign neoplasm of transverse colon: Secondary | ICD-10-CM | POA: Diagnosis not present

## 2023-01-23 DIAGNOSIS — D122 Benign neoplasm of ascending colon: Secondary | ICD-10-CM | POA: Diagnosis not present

## 2023-01-23 DIAGNOSIS — Z1211 Encounter for screening for malignant neoplasm of colon: Secondary | ICD-10-CM | POA: Diagnosis not present

## 2023-01-23 DIAGNOSIS — K5730 Diverticulosis of large intestine without perforation or abscess without bleeding: Secondary | ICD-10-CM | POA: Diagnosis not present

## 2023-01-23 DIAGNOSIS — D124 Benign neoplasm of descending colon: Secondary | ICD-10-CM | POA: Diagnosis not present

## 2023-01-24 NOTE — Pre-Procedure Instructions (Signed)
Surgical Instructions   Your procedure is scheduled on Monday, July 22nd. Report to [facility] Main Entrance "A" at 05:30 A.M., then check in with the Admitting office. Any questions or running late day of surgery: call [phone]  Questions prior to your surgery date: call [phone], Monday-Friday, 8am-4pm. If you experience any cold or flu symptoms such as cough, fever, chills, shortness of breath, etc. between now and your scheduled surgery, please notify us at the above number.     Remember:  Do not eat or drink after midnight the night before your surgery     Take these medicines the morning of surgery with A SIP OF WATER  anastrozole (ARIMIDEX)  fexofenadine (ALLEGRA)    May take these medicines IF NEEDED: Polyethyl Glycol-Propyl Glycol (LUBRICATING EYE DROPS OP)   Hold Aspirin for 5 days prior to surgery. Last dose 7/16.  One week prior to surgery, STOP taking any Aleve, Naproxen, Ibuprofen, Motrin, Advil, Goody's, BC's, all herbal medications, fish oil, and non-prescription vitamins.                     Do NOT Smoke (Tobacco/Vaping) for 24 hours prior to your procedure.  If you use a CPAP at night, you may bring your mask/headgear for your overnight stay.   You will be asked to remove any contacts, glasses, piercing's, hearing aid's, dentures/partials prior to surgery. Please bring cases for these items if needed.    Patients discharged the day of surgery will not be allowed to drive home, and someone needs to stay with them for 24 hours.  SURGICAL WAITING ROOM VISITATION Patients may have no more than 2 support people in the waiting area - these visitors may rotate.   Pre-op nurse will coordinate an appropriate time for 1 ADULT support person, who may not rotate, to accompany patient in pre-op.  Children under the age of 2 must have an adult with them who is not the patient and must remain in the main waiting area with an adult.  If the patient needs to stay at  the hospital during part of their recovery, the visitor guidelines for inpatient rooms apply.  Please refer to the [facility] website for the visitor guidelines for any additional information.   If you received a COVID test during your pre-op visit  it is requested that you wear a mask when out in public, stay away from anyone that may not be feeling well and notify your surgeon if you develop symptoms. If you have been in contact with anyone that has tested positive in the last 10 days please notify you surgeon.      Pre-operative 5 CHG Bath Instructions   You can play a key role in reducing the risk of infection after surgery. Your skin needs to be as free of germs as possible. You can reduce the number of germs on your skin by washing with CHG (chlorhexidine gluconate) soap before surgery. CHG is an antiseptic soap that kills germs and continues to kill germs even after washing.   DO NOT use if you have an allergy to chlorhexidine/CHG or antibacterial soaps. If your skin becomes reddened or irritated, stop using the CHG and notify one of our RNs at [phone].   Please shower with the CHG soap starting 4 days before surgery using the following schedule:     Please keep in mind the following:  DO NOT shave, including legs and underarms, starting the day of your first shower.  You may shave your face at any point before/day of surgery.  Place clean sheets on your bed the day you start using CHG soap. Use a clean washcloth (not used since being washed) for each shower. DO NOT sleep with pets once you start using the CHG.   CHG Shower Instructions:  If you choose to wash your hair and private area, wash first with your normal shampoo/soap.  After you use shampoo/soap, rinse your hair and body thoroughly to remove shampoo/soap residue.  Turn the water OFF and apply about 3 tablespoons (45 ml) of CHG soap to a CLEAN washcloth.  Apply CHG soap ONLY FROM YOUR NECK DOWN TO YOUR TOES  (washing for 3-5 minutes)  DO NOT use CHG soap on face, private areas, open wounds, or sores.  Pay special attention to the area where your surgery is being performed.  If you are having back surgery, having someone wash your back for you may be helpful. Wait 2 minutes after CHG soap is applied, then you may rinse off the CHG soap.  Pat dry with a clean towel  Put on clean clothes/pajamas   If you choose to wear lotion, please use ONLY the CHG-compatible lotions on the back of this paper.   Additional instructions for the day of surgery: DO NOT APPLY any lotions, deodorants, cologne, or perfumes.   Do not bring valuables to the hospital. [facility] is not responsible for any belongings/valuables. Do not wear nail polish, gel polish, artificial nails, or any other type of covering on natural nails (fingers and toes) Do not wear jewelry or makeup Put on clean/comfortable clothes.  Please brush your teeth.  Ask your nurse before applying any prescription medications to the skin.     CHG Compatible Lotions   Aveeno Moisturizing lotion  Cetaphil Moisturizing Cream  Cetaphil Moisturizing Lotion  Clairol Herbal Essence Moisturizing Lotion, Dry Skin  Clairol Herbal Essence Moisturizing Lotion, Extra Dry Skin  Clairol Herbal Essence Moisturizing Lotion, Normal Skin  Curel Age Defying Therapeutic Moisturizing Lotion with Alpha Hydroxy  Curel Extreme Care Body Lotion  Curel Soothing Hands Moisturizing Hand Lotion  Curel Therapeutic Moisturizing Cream, Fragrance-Free  Curel Therapeutic Moisturizing Lotion, Fragrance-Free  Curel Therapeutic Moisturizing Lotion, Original Formula  Eucerin Daily Replenishing Lotion  Eucerin Dry Skin Therapy Plus Alpha Hydroxy Crme  Eucerin Dry Skin Therapy Plus Alpha Hydroxy Lotion  Eucerin Original Crme  Eucerin Original Lotion  Eucerin Plus Crme Eucerin Plus Lotion  Eucerin TriLipid Replenishing Lotion  Keri Anti-Bacterial Hand Lotion  Keri Deep  Conditioning Original Lotion Dry Skin Formula Softly Scented  Keri Deep Conditioning Original Lotion, Fragrance Free Sensitive Skin Formula  Keri Lotion Fast Absorbing Fragrance Free Sensitive Skin Formula  Keri Lotion Fast Absorbing Softly Scented Dry Skin Formula  Keri Original Lotion  Keri Skin Renewal Lotion Keri Silky Smooth Lotion  Keri Silky Smooth Sensitive Skin Lotion  Nivea Body Creamy Conditioning Oil  Nivea Body Extra Enriched Lotion  Nivea Body Original Lotion  Nivea Body Sheer Moisturizing Lotion Nivea Crme  Nivea Skin Firming Lotion  NutraDerm 30 Skin Lotion  NutraDerm Skin Lotion  NutraDerm Therapeutic Skin Cream  NutraDerm Therapeutic Skin Lotion  ProShield Protective Hand Cream  Provon moisturizing lotion  Please read over the following fact sheets that you were given.

## 2023-01-27 ENCOUNTER — Encounter (HOSPITAL_COMMUNITY)
Admission: RE | Admit: 2023-01-27 | Discharge: 2023-01-27 | Disposition: A | Discharge status: Home / Self Care | Payer: Medicare Other | Source: Ambulatory Visit | Attending: Neurosurgery | Admitting: Neurosurgery

## 2023-01-27 ENCOUNTER — Other Ambulatory Visit: Payer: Self-pay | Admitting: Internal Medicine

## 2023-01-27 ENCOUNTER — Other Ambulatory Visit: Payer: Self-pay

## 2023-01-27 ENCOUNTER — Encounter (HOSPITAL_COMMUNITY): Payer: Self-pay

## 2023-01-27 VITALS — BP 137/82 | HR 83 | Temp 98.4°F | Resp 19 | Ht 70.0 in | Wt 295.3 lb

## 2023-01-27 DIAGNOSIS — Z8673 Personal history of transient ischemic attack (TIA), and cerebral infarction without residual deficits: Secondary | ICD-10-CM | POA: Diagnosis not present

## 2023-01-27 DIAGNOSIS — E785 Hyperlipidemia, unspecified: Secondary | ICD-10-CM | POA: Insufficient documentation

## 2023-01-27 DIAGNOSIS — Q438 Other specified congenital malformations of intestine: Secondary | ICD-10-CM

## 2023-01-27 DIAGNOSIS — G4733 Obstructive sleep apnea (adult) (pediatric): Secondary | ICD-10-CM | POA: Diagnosis not present

## 2023-01-27 DIAGNOSIS — Z87891 Personal history of nicotine dependence: Secondary | ICD-10-CM | POA: Diagnosis not present

## 2023-01-27 DIAGNOSIS — D124 Benign neoplasm of descending colon: Secondary | ICD-10-CM | POA: Diagnosis not present

## 2023-01-27 DIAGNOSIS — Z8774 Personal history of (corrected) congenital malformations of heart and circulatory system: Secondary | ICD-10-CM | POA: Insufficient documentation

## 2023-01-27 DIAGNOSIS — M4316 Spondylolisthesis, lumbar region: Secondary | ICD-10-CM | POA: Insufficient documentation

## 2023-01-27 DIAGNOSIS — Z01818 Encounter for other preprocedural examination: Secondary | ICD-10-CM | POA: Insufficient documentation

## 2023-01-27 LAB — CBC
HCT: 48.3 % (ref 39.0–52.0)
Hemoglobin: 15.6 g/dL (ref 13.0–17.0)
MCH: 30.1 pg (ref 26.0–34.0)
MCHC: 32.3 g/dL (ref 30.0–36.0)
MCV: 93.2 fL (ref 80.0–100.0)
Platelets: 288 10*3/uL (ref 150–400)
RBC: 5.18 MIL/uL (ref 4.22–5.81)
RDW: 15.6 % — ABNORMAL HIGH (ref 11.5–15.5)
WBC: 7.7 10*3/uL (ref 4.0–10.5)
nRBC: 0 % (ref 0.0–0.2)

## 2023-01-27 LAB — SURGICAL PCR SCREEN
MRSA, PCR: NEGATIVE
Staphylococcus aureus: NEGATIVE

## 2023-01-27 LAB — BASIC METABOLIC PANEL
Anion gap: 9 (ref 5–15)
BUN: 15 mg/dL (ref 8–23)
CO2: 23 mmol/L (ref 22–32)
Calcium: 9 mg/dL (ref 8.9–10.3)
Chloride: 108 mmol/L (ref 98–111)
Creatinine, Ser: 1.14 mg/dL (ref 0.61–1.24)
GFR, Estimated: >60 mL/min (ref >60)
Glucose, Bld: 176 mg/dL — ABNORMAL HIGH (ref 70–99)
Potassium: 3.9 mmol/L (ref 3.5–5.1)
Sodium: 140 mmol/L (ref 135–145)

## 2023-01-27 LAB — TYPE AND SCREEN
ABO/RH(D): O POS
Antibody Screen: NEGATIVE

## 2023-01-27 NOTE — Progress Notes (Signed)
PCP - [facility] Cardiologist - Dr.  Rubie Maid  PPM/ICD - denies  Chest x-ray - n/a EKG - PAT, 01/27/2023 Stress Test -  ECHO - 02/18/2022 Cardiac Cath - 02/18/2022  Sleep Study - Yes, diagnosed with sleep apnea CPAP - Uses a BiPaP  Non-diabetic  Blood Thinner Instructions: denies Aspirin Instructions:Was instructed to stop 01/29/2023  ERAS Protcol -No, NPO PRE-SURGERY Ensure or G2-   COVID TEST- n/a  Anesthesia review: Yes, stroke, sleep apnea  Patient denies shortness of breath, fever, cough and chest pain at PAT appointment   All instructions explained to the patient, with a verbal understanding of the material. Patient agrees to go over the instructions while at home for a better understanding. Patient also instructed to self quarantine after being tested for COVID-19. The opportunity to ask questions was provided.

## 2023-01-28 NOTE — Anesthesia Preprocedure Evaluation (Addendum)
Anesthesia Evaluation  Patient identified by MRN, date of birth, ID band Patient awake    Reviewed: Allergy & Precautions, NPO status , Patient's Chart, lab work & pertinent test results  History of Anesthesia Complications Negative for: history of anesthetic complications  Airway Mallampati: II  TM Distance: >3 FB Neck ROM: Full    Dental  (+) Dental Advisory Given   Pulmonary sleep apnea and Continuous Positive Airway Pressure Ventilation , former smoker   breath sounds clear to auscultation       Cardiovascular (-) angina  Rhythm:Regular Rate:Normal  PFO: occluded   Neuro/Psych   Anxiety Depression       GI/Hepatic Neg liver ROS, hiatal hernia,GERD  Controlled,,  Endo/Other  BMI 42.4  Renal/GU negative Renal ROS     Musculoskeletal  (+) Arthritis ,    Abdominal  (+) + obese  Peds  Hematology negative hematology ROS (+)   Anesthesia Other Findings   Reproductive/Obstetrics                             Anesthesia Physical Anesthesia Plan  ASA: 3  Anesthesia Plan: General   Post-op Pain Management: Tylenol PO (pre-op)* and Dilaudid IV   Induction: Intravenous  PONV Risk Score and Plan: 2 and Dexamethasone  Airway Management Planned: Oral ETT and Video Laryngoscope Planned  Additional Equipment: None  Intra-op Plan:   Post-operative Plan: Extubation in OR  Informed Consent: I have reviewed the patients History and Physical, chart, labs and discussed the procedure including the risks, benefits and alternatives for the proposed anesthesia with the patient or authorized representative who has indicated his/her understanding and acceptance.     Dental advisory given  Plan Discussed with: CRNA and Surgeon  Anesthesia Plan Comments: (PAT note written 01/28/2023 by Shonna Chock, PA-C.  )       Anesthesia Quick Evaluation
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
## 2023-02-03 ENCOUNTER — Other Ambulatory Visit: Payer: Self-pay

## 2023-02-03 ENCOUNTER — Ambulatory Visit (HOSPITAL_COMMUNITY): Payer: Medicare Other | Admitting: Vascular Surgery

## 2023-02-03 ENCOUNTER — Observation Stay (HOSPITAL_COMMUNITY)
Admission: RE | Admit: 2023-02-03 | Discharge: 2023-02-06 | Disposition: A | Discharge status: Home / Self Care | Payer: Medicare Other | Attending: Neurosurgery | Admitting: Neurosurgery

## 2023-02-03 ENCOUNTER — Ambulatory Visit (HOSPITAL_COMMUNITY): Payer: Medicare Other

## 2023-02-03 ENCOUNTER — Ambulatory Visit (HOSPITAL_BASED_OUTPATIENT_CLINIC_OR_DEPARTMENT_OTHER): Payer: Medicare Other | Admitting: Anesthesiology

## 2023-02-03 ENCOUNTER — Encounter (HOSPITAL_COMMUNITY)
Admission: RE | Disposition: A | Discharge status: Home / Self Care | Payer: Self-pay | Source: Home / Self Care | Attending: Neurosurgery

## 2023-02-03 ENCOUNTER — Encounter (HOSPITAL_COMMUNITY): Payer: Self-pay | Admitting: Neurosurgery

## 2023-02-03 DIAGNOSIS — M4316 Spondylolisthesis, lumbar region: Secondary | ICD-10-CM | POA: Diagnosis not present

## 2023-02-03 DIAGNOSIS — M48061 Spinal stenosis, lumbar region without neurogenic claudication: Secondary | ICD-10-CM | POA: Diagnosis not present

## 2023-02-03 DIAGNOSIS — Z79899 Other long term (current) drug therapy: Secondary | ICD-10-CM | POA: Insufficient documentation

## 2023-02-03 DIAGNOSIS — Z6841 Body Mass Index (BMI) 40.0 and over, adult: Secondary | ICD-10-CM

## 2023-02-03 DIAGNOSIS — Z8673 Personal history of transient ischemic attack (TIA), and cerebral infarction without residual deficits: Secondary | ICD-10-CM | POA: Diagnosis not present

## 2023-02-03 DIAGNOSIS — E669 Obesity, unspecified: Secondary | ICD-10-CM

## 2023-02-03 DIAGNOSIS — Z87891 Personal history of nicotine dependence: Secondary | ICD-10-CM | POA: Insufficient documentation

## 2023-02-03 DIAGNOSIS — M5416 Radiculopathy, lumbar region: Secondary | ICD-10-CM | POA: Diagnosis not present

## 2023-02-03 DIAGNOSIS — G4730 Sleep apnea, unspecified: Secondary | ICD-10-CM | POA: Diagnosis not present

## 2023-02-03 DIAGNOSIS — M48062 Spinal stenosis, lumbar region with neurogenic claudication: Secondary | ICD-10-CM | POA: Diagnosis not present

## 2023-02-03 DIAGNOSIS — Z7982 Long term (current) use of aspirin: Secondary | ICD-10-CM | POA: Diagnosis not present

## 2023-02-03 DIAGNOSIS — Z981 Arthrodesis status: Secondary | ICD-10-CM | POA: Diagnosis not present

## 2023-02-03 DIAGNOSIS — F418 Other specified anxiety disorders: Secondary | ICD-10-CM

## 2023-02-03 LAB — ABO/RH: ABO/RH(D): O POS

## 2023-02-03 SURGERY — POSTERIOR LUMBAR FUSION 1 LEVEL
Anesthesia: General

## 2023-02-03 MED ORDER — BUPIVACAINE LIPOSOME 1.3 % IJ SUSP
INTRAMUSCULAR | Status: AC
  Filled 2023-02-03: qty 20

## 2023-02-03 MED ORDER — ONDANSETRON HCL 4 MG/2ML IJ SOLN: 4.0000 mg | Freq: Four times a day (QID) | INTRAMUSCULAR | Status: DC | PRN

## 2023-02-03 MED ORDER — CYCLOBENZAPRINE HCL 10 MG PO TABS: 10.0000 mg | ORAL_TABLET | Freq: Three times a day (TID) | ORAL | Status: DC | PRN

## 2023-02-03 MED ORDER — PROPOFOL 10 MG/ML IV BOLUS
INTRAVENOUS | Status: AC
  Filled 2023-02-03: qty 20

## 2023-02-03 MED ORDER — ONDANSETRON HCL 4 MG/2ML IJ SOLN
INTRAMUSCULAR | Status: DC | PRN
  Administered 2023-02-03: 4 mg via INTRAVENOUS

## 2023-02-03 MED ORDER — LIDOCAINE 2% (20 MG/ML) 5 ML SYRINGE
INTRAMUSCULAR | Status: AC
  Filled 2023-02-03: qty 5

## 2023-02-03 MED ORDER — HYDROMORPHONE HCL 1 MG/ML IJ SOLN
INTRAMUSCULAR | Status: AC
  Filled 2023-02-03: qty 0.5

## 2023-02-03 MED ORDER — SUGAMMADEX SODIUM 200 MG/2ML IV SOLN
INTRAVENOUS | Status: DC | PRN
  Administered 2023-02-03: 400 mg via INTRAVENOUS

## 2023-02-03 MED ORDER — OXYCODONE HCL 5 MG PO TABS: 5.0000 mg | ORAL_TABLET | Freq: Once | ORAL | Status: DC | PRN

## 2023-02-03 MED ORDER — LORATADINE 10 MG PO TABS
10.0000 mg | ORAL_TABLET | Freq: Every day | ORAL | Status: DC
  Administered 2023-02-04 – 2023-02-05 (×2): 10 mg via ORAL
  Filled 2023-02-03 (×2): qty 1

## 2023-02-03 MED ORDER — ONDANSETRON HCL 4 MG/2ML IJ SOLN
INTRAMUSCULAR | Status: AC
  Filled 2023-02-03: qty 2

## 2023-02-03 MED ORDER — ATORVASTATIN CALCIUM 40 MG PO TABS
40.0000 mg | ORAL_TABLET | Freq: Every day | ORAL | Status: DC
  Administered 2023-02-03 – 2023-02-05 (×3): 40 mg via ORAL
  Filled 2023-02-03 (×3): qty 1

## 2023-02-03 MED ORDER — CHLORHEXIDINE GLUCONATE CLOTH 2 % EX PADS: 6.0000 | MEDICATED_PAD | Freq: Once | CUTANEOUS | Status: DC

## 2023-02-03 MED ORDER — MIDAZOLAM HCL 2 MG/2ML IJ SOLN
INTRAMUSCULAR | Status: DC | PRN
  Administered 2023-02-03: 2 mg via INTRAVENOUS

## 2023-02-03 MED ORDER — SODIUM CHLORIDE 0.9% FLUSH: 3.0000 mL | INTRAVENOUS | Status: DC | PRN

## 2023-02-03 MED ORDER — ACETAMINOPHEN 325 MG PO TABS
650.0000 mg | ORAL_TABLET | ORAL | Status: DC | PRN
  Administered 2023-02-05 – 2023-02-06 (×2): 650 mg via ORAL
  Filled 2023-02-03 (×2): qty 2

## 2023-02-03 MED ORDER — ORAL CARE MOUTH RINSE: 15.0000 mL | Freq: Once | OROMUCOSAL | Status: AC

## 2023-02-03 MED ORDER — DEXAMETHASONE SODIUM PHOSPHATE 10 MG/ML IJ SOLN
INTRAMUSCULAR | Status: DC | PRN
  Administered 2023-02-03: 10 mg via INTRAVENOUS

## 2023-02-03 MED ORDER — FENTANYL CITRATE (PF) 250 MCG/5ML IJ SOLN
INTRAMUSCULAR | Status: AC
  Filled 2023-02-03: qty 5

## 2023-02-03 MED ORDER — BUPIVACAINE-EPINEPHRINE (PF) 0.5% -1:200000 IJ SOLN
INTRAMUSCULAR | Status: DC | PRN
  Administered 2023-02-03: 10 mL via PERINEURAL

## 2023-02-03 MED ORDER — PHENYLEPHRINE HCL-NACL 20-0.9 MG/250ML-% IV SOLN
INTRAVENOUS | Status: DC | PRN
  Administered 2023-02-03: 25 ug/min via INTRAVENOUS

## 2023-02-03 MED ORDER — ROCURONIUM BROMIDE 10 MG/ML (PF) SYRINGE
PREFILLED_SYRINGE | INTRAVENOUS | Status: AC
  Filled 2023-02-03: qty 10

## 2023-02-03 MED ORDER — EPHEDRINE 5 MG/ML INJ
INTRAVENOUS | Status: AC
  Filled 2023-02-03: qty 5

## 2023-02-03 MED ORDER — PHENOL 1.4 % MT LIQD: 1.0000 | OROMUCOSAL | Status: DC | PRN

## 2023-02-03 MED ORDER — OXYCODONE HCL 5 MG PO TABS
10.0000 mg | ORAL_TABLET | ORAL | Status: DC | PRN
  Administered 2023-02-05: 10 mg via ORAL
  Filled 2023-02-03: qty 2

## 2023-02-03 MED ORDER — BACITRACIN ZINC 500 UNIT/GM EX OINT
TOPICAL_OINTMENT | CUTANEOUS | Status: AC
  Filled 2023-02-03: qty 28.35

## 2023-02-03 MED ORDER — PHENYLEPHRINE 80 MCG/ML (10ML) SYRINGE FOR IV PUSH (FOR BLOOD PRESSURE SUPPORT)
PREFILLED_SYRINGE | INTRAVENOUS | Status: AC
  Filled 2023-02-03: qty 10

## 2023-02-03 MED ORDER — CEFAZOLIN SODIUM-DEXTROSE 2-4 GM/100ML-% IV SOLN
2.0000 g | Freq: Three times a day (TID) | INTRAVENOUS | Status: AC
  Administered 2023-02-03 – 2023-02-04 (×2): 2 g via INTRAVENOUS
  Filled 2023-02-03 (×2): qty 100

## 2023-02-03 MED ORDER — ONDANSETRON HCL 4 MG PO TABS: 4.0000 mg | ORAL_TABLET | Freq: Four times a day (QID) | ORAL | Status: DC | PRN

## 2023-02-03 MED ORDER — EPHEDRINE SULFATE-NACL 50-0.9 MG/10ML-% IV SOSY
PREFILLED_SYRINGE | INTRAVENOUS | Status: DC | PRN
  Administered 2023-02-03: 10 mg via INTRAVENOUS

## 2023-02-03 MED ORDER — MORPHINE SULFATE (PF) 4 MG/ML IV SOLN: 4.0000 mg | INTRAVENOUS | Status: DC | PRN

## 2023-02-03 MED ORDER — BUPIVACAINE LIPOSOME 1.3 % IJ SUSP
INTRAMUSCULAR | Status: DC | PRN
  Administered 2023-02-03: 20 mL

## 2023-02-03 MED ORDER — CHLORHEXIDINE GLUCONATE 0.12 % MT SOLN
15.0000 mL | Freq: Once | OROMUCOSAL | Status: AC
  Administered 2023-02-03: 15 mL via OROMUCOSAL
  Filled 2023-02-03: qty 15

## 2023-02-03 MED ORDER — SODIUM CHLORIDE 0.9% FLUSH
3.0000 mL | Freq: Two times a day (BID) | INTRAVENOUS | Status: DC
  Administered 2023-02-03: 3 mL via INTRAVENOUS

## 2023-02-03 MED ORDER — THROMBIN 5000 UNITS EX SOLR
CUTANEOUS | Status: AC
  Filled 2023-02-03: qty 5000

## 2023-02-03 MED ORDER — LIDOCAINE 2% (20 MG/ML) 5 ML SYRINGE
INTRAMUSCULAR | Status: DC | PRN
  Administered 2023-02-03: 30 mg via INTRAVENOUS

## 2023-02-03 MED ORDER — SURGIRINSE WOUND IRRIGATION SYSTEM - OPTIME
TOPICAL | Status: DC | PRN
  Administered 2023-02-03: 450 mL via TOPICAL

## 2023-02-03 MED ORDER — OXYCODONE HCL 5 MG PO TABS: 5.0000 mg | ORAL_TABLET | ORAL | Status: DC | PRN

## 2023-02-03 MED ORDER — BUPIVACAINE-EPINEPHRINE (PF) 0.5% -1:200000 IJ SOLN
INTRAMUSCULAR | Status: AC
  Filled 2023-02-03: qty 30

## 2023-02-03 MED ORDER — ROCURONIUM BROMIDE 10 MG/ML (PF) SYRINGE
PREFILLED_SYRINGE | INTRAVENOUS | Status: DC | PRN
  Administered 2023-02-03 (×3): 20 mg via INTRAVENOUS
  Administered 2023-02-03: 60 mg via INTRAVENOUS
  Administered 2023-02-03: 40 mg via INTRAVENOUS

## 2023-02-03 MED ORDER — 0.9 % SODIUM CHLORIDE (POUR BTL) OPTIME
TOPICAL | Status: DC | PRN
  Administered 2023-02-03: 1000 mL

## 2023-02-03 MED ORDER — HYDROMORPHONE HCL 1 MG/ML IJ SOLN: 0.2500 mg | INTRAMUSCULAR | Status: DC | PRN

## 2023-02-03 MED ORDER — SODIUM CHLORIDE 0.9 % IV SOLN: 250.0000 mL | INTRAVENOUS | Status: DC

## 2023-02-03 MED ORDER — MIDAZOLAM HCL 2 MG/2ML IJ SOLN: 0.5000 mg | Freq: Once | INTRAMUSCULAR | Status: DC | PRN

## 2023-02-03 MED ORDER — VANCOMYCIN HCL 1500 MG/300ML IV SOLN
1500.0000 mg | INTRAVENOUS | Status: AC
  Administered 2023-02-03: 1500 mg via INTRAVENOUS
  Filled 2023-02-03: qty 300

## 2023-02-03 MED ORDER — BISACODYL 10 MG RE SUPP: 10.0000 mg | Freq: Every day | RECTAL | Status: DC | PRN

## 2023-02-03 MED ORDER — MIDAZOLAM HCL 2 MG/2ML IJ SOLN
INTRAMUSCULAR | Status: AC
  Filled 2023-02-03: qty 2

## 2023-02-03 MED ORDER — ALBUMIN HUMAN 5 % IV SOLN: INTRAVENOUS | Status: DC | PRN

## 2023-02-03 MED ORDER — ACETAMINOPHEN 500 MG PO TABS
1000.0000 mg | ORAL_TABLET | Freq: Once | ORAL | Status: AC
  Administered 2023-02-03: 1000 mg via ORAL
  Filled 2023-02-03: qty 2

## 2023-02-03 MED ORDER — ACETAMINOPHEN 500 MG PO TABS
1000.0000 mg | ORAL_TABLET | Freq: Four times a day (QID) | ORAL | Status: AC
  Administered 2023-02-03 – 2023-02-04 (×3): 1000 mg via ORAL
  Filled 2023-02-03 (×3): qty 2

## 2023-02-03 MED ORDER — DEXAMETHASONE SODIUM PHOSPHATE 10 MG/ML IJ SOLN
INTRAMUSCULAR | Status: AC
  Filled 2023-02-03: qty 1

## 2023-02-03 MED ORDER — ACETAMINOPHEN 650 MG RE SUPP: 650.0000 mg | RECTAL | Status: DC | PRN

## 2023-02-03 MED ORDER — THROMBIN 5000 UNITS EX SOLR
OROMUCOSAL | Status: DC | PRN
  Administered 2023-02-03: 5 mL via TOPICAL

## 2023-02-03 MED ORDER — MENTHOL 3 MG MT LOZG: 1.0000 | LOZENGE | OROMUCOSAL | Status: DC | PRN

## 2023-02-03 MED ORDER — HYDROMORPHONE HCL 1 MG/ML IJ SOLN
INTRAMUSCULAR | Status: DC | PRN
  Administered 2023-02-03 (×2): .5 mg via INTRAVENOUS

## 2023-02-03 MED ORDER — FENTANYL CITRATE (PF) 250 MCG/5ML IJ SOLN
INTRAMUSCULAR | Status: DC | PRN
  Administered 2023-02-03: 250 ug via INTRAVENOUS

## 2023-02-03 MED ORDER — PROPOFOL 10 MG/ML IV BOLUS
INTRAVENOUS | Status: DC | PRN
  Administered 2023-02-03: 100 mg via INTRAVENOUS

## 2023-02-03 MED ORDER — OXYCODONE HCL 5 MG/5ML PO SOLN: 5.0000 mg | Freq: Once | ORAL | Status: DC | PRN

## 2023-02-03 MED ORDER — BACITRACIN ZINC 500 UNIT/GM EX OINT
TOPICAL_OINTMENT | CUTANEOUS | Status: DC | PRN
  Administered 2023-02-03: 1 via TOPICAL

## 2023-02-03 MED ORDER — DOCUSATE SODIUM 100 MG PO CAPS
100.0000 mg | ORAL_CAPSULE | Freq: Two times a day (BID) | ORAL | Status: DC
  Administered 2023-02-03 – 2023-02-05 (×5): 100 mg via ORAL
  Filled 2023-02-03 (×5): qty 1

## 2023-02-03 MED ORDER — LACTATED RINGERS IV SOLN: INTRAVENOUS | Status: DC

## 2023-02-03 MED ORDER — PROMETHAZINE HCL 25 MG/ML IJ SOLN: 6.2500 mg | INTRAMUSCULAR | Status: DC | PRN

## 2023-02-03 SURGICAL SUPPLY — 65 items
APL SKNCLS STERI-STRIP NONHPOA (GAUZE/BANDAGES/DRESSINGS) ×1
BAG COUNTER SPONGE SURGICOUNT (BAG) ×1 IMPLANT
BAG SPNG CNTER NS LX DISP (BAG) ×1
BASKET BONE COLLECTION (BASKET) ×1 IMPLANT
BENZOIN TINCTURE PRP APPL 2/3 (GAUZE/BANDAGES/DRESSINGS) ×1 IMPLANT
BLADE CLIPPER SURG (BLADE) IMPLANT
BUR MATCHSTICK NEURO 3.0 LAGG (BURR) ×1 IMPLANT
BUR PRECISION FLUTE 6.0 (BURR) ×1 IMPLANT
CAGE ALTERA 10X31X10-14 15D (Cage) IMPLANT
CANISTER SUCT 3000ML PPV (MISCELLANEOUS) ×1 IMPLANT
CAP LOCK DLX THRD (Cap) IMPLANT
CNTNR URN SCR LID CUP LEK RST (MISCELLANEOUS) ×1 IMPLANT
CONT SPEC 4OZ STRL OR WHT (MISCELLANEOUS) ×1
COVER BACK TABLE 60X90IN (DRAPES) ×1 IMPLANT
DRAPE C-ARM 42X72 X-RAY (DRAPES) ×2 IMPLANT
DRAPE HALF SHEET 40X57 (DRAPES) ×1 IMPLANT
DRAPE LAPAROTOMY 100X72X124 (DRAPES) ×1 IMPLANT
DRAPE SURG 17X23 STRL (DRAPES) ×4 IMPLANT
DRSG OPSITE POSTOP 4X6 (GAUZE/BANDAGES/DRESSINGS) ×1 IMPLANT
ELECT BLADE 4.0 EZ CLEAN MEGAD (MISCELLANEOUS) ×1
ELECT REM PT RETURN 9FT ADLT (ELECTROSURGICAL) ×1
ELECTRODE BLDE 4.0 EZ CLN MEGD (MISCELLANEOUS) ×1 IMPLANT
ELECTRODE REM PT RTRN 9FT ADLT (ELECTROSURGICAL) ×1 IMPLANT
EVACUATOR 1/8 PVC DRAIN (DRAIN) IMPLANT
GAUZE 4X4 16PLY ~~LOC~~+RFID DBL (SPONGE) ×1 IMPLANT
GLOVE BIO SURGEON STRL SZ 6 (GLOVE) ×1 IMPLANT
GLOVE BIO SURGEON STRL SZ8 (GLOVE) ×2 IMPLANT
GLOVE BIO SURGEON STRL SZ8.5 (GLOVE) ×2 IMPLANT
GLOVE BIOGEL PI IND STRL 6.5 (GLOVE) ×1 IMPLANT
GLOVE EXAM NITRILE XL STR (GLOVE) IMPLANT
GOWN STRL REUS W/ TWL LRG LVL3 (GOWN DISPOSABLE) ×1 IMPLANT
GOWN STRL REUS W/ TWL XL LVL3 (GOWN DISPOSABLE) ×2 IMPLANT
GOWN STRL REUS W/TWL 2XL LVL3 (GOWN DISPOSABLE) IMPLANT
GOWN STRL REUS W/TWL LRG LVL3 (GOWN DISPOSABLE) ×1
GOWN STRL REUS W/TWL XL LVL3 (GOWN DISPOSABLE) ×2
HEMOSTAT POWDER KIT SURGIFOAM (HEMOSTASIS) ×1 IMPLANT
KIT BASIN OR (CUSTOM PROCEDURE TRAY) ×1 IMPLANT
KIT GRAFTMAG DEL NEURO DISP (NEUROSURGERY SUPPLIES) IMPLANT
KIT POSITION SURG JACKSON T1 (MISCELLANEOUS) ×1 IMPLANT
KIT TURNOVER KIT B (KITS) ×1 IMPLANT
NDL HYPO 21X1.5 SAFETY (NEEDLE) IMPLANT
NDL HYPO 22X1.5 SAFETY MO (MISCELLANEOUS) ×1 IMPLANT
NEEDLE HYPO 21X1.5 SAFETY (NEEDLE) IMPLANT
NEEDLE HYPO 22X1.5 SAFETY MO (MISCELLANEOUS) ×1 IMPLANT
NS IRRIG 1000ML POUR BTL (IV SOLUTION) ×1 IMPLANT
PACK LAMINECTOMY NEURO (CUSTOM PROCEDURE TRAY) ×1 IMPLANT
PAD ARMBOARD 7.5X6 YLW CONV (MISCELLANEOUS) ×3 IMPLANT
PATTIES SURGICAL .5 X1 (DISPOSABLE) IMPLANT
PUTTY DBM 10CC CALC GRAN (Putty) IMPLANT
ROD CURVED TI 6.35X50 (Rod) IMPLANT
SCREW PA DLX CREO 7.5X55 (Screw) IMPLANT
SOLUTION IRRIG SURGIPHOR (IV SOLUTION) ×1 IMPLANT
SPIKE FLUID TRANSFER (MISCELLANEOUS) ×1 IMPLANT
SPONGE NEURO XRAY DETECT 1X3 (DISPOSABLE) IMPLANT
SPONGE SURGIFOAM ABS GEL 100 (HEMOSTASIS) IMPLANT
SPONGE T-LAP 4X18 ~~LOC~~+RFID (SPONGE) IMPLANT
STRIP CLOSURE SKIN 1/2X4 (GAUZE/BANDAGES/DRESSINGS) ×1 IMPLANT
SUT VIC AB 1 CT1 18XBRD ANBCTR (SUTURE) ×2 IMPLANT
SUT VIC AB 1 CT1 8-18 (SUTURE) ×2
SUT VIC AB 2-0 CP2 18 (SUTURE) ×2 IMPLANT
SYR 20ML LL LF (SYRINGE) IMPLANT
TOWEL GREEN STERILE (TOWEL DISPOSABLE) ×1 IMPLANT
TOWEL GREEN STERILE FF (TOWEL DISPOSABLE) ×1 IMPLANT
TRAY FOLEY MTR SLVR 16FR STAT (SET/KITS/TRAYS/PACK) ×1 IMPLANT
WATER STERILE IRR 1000ML POUR (IV SOLUTION) ×1 IMPLANT

## 2023-02-03 NOTE — Progress Notes (Signed)
Orthopedic Tech Progress Note Patient Details:  Paul Hinton 1956-07-05 [id number]  Ortho Devices Type of Ortho Device: Lumbar corsett Ortho Device/Splint Location: BACK Ortho Device/Splint Interventions: Ordered   Post Interventions Patient Tolerated: Well Instructions Provided: Care of device  [name] 02/03/2023, 12:45 PM

## 2023-02-03 NOTE — Anesthesia Procedure Notes (Addendum)
Procedure Name: Intubation Date/Time: 02/03/2023 7:43 AM  Performed by: Sharyn Dross, CRNAPre-anesthesia Checklist: Patient identified, Emergency Drugs available, Suction available and Patient being monitored Patient Re-evaluated:Patient Re-evaluated prior to induction Oxygen Delivery Method: Circle system utilized Preoxygenation: Pre-oxygenation with 100% oxygen Induction Type: IV induction Ventilation: Mask ventilation without difficulty and Oral airway inserted - appropriate to patient size Laryngoscope Size: Glidescope and 4 Grade View: Grade I Tube type: Oral Tube size: 7.5 mm Number of attempts: 1 Airway Equipment and Method: Oral airway and Rigid stylet Placement Confirmation: ETT inserted through vocal cords under direct vision, positive ETCO2 and breath sounds checked- equal and bilateral Secured at: 23 cm Tube secured with: Tape Dental Injury: Teeth and Oropharynx as per pre-operative assessment

## 2023-02-03 NOTE — Anesthesia Postprocedure Evaluation (Signed)
Anesthesia Post Note  Patient: Sayvion Vigen  Procedure(s) Performed: POSTERIOR LUMBAR INTERBODY FUSION,INTERBODY PROSTHESIS,POSTERIOR INSTRUMENTATION, LUMBAR THREE-FOUR     Patient location during evaluation: PACU Anesthesia Type: General Level of consciousness: awake and alert, patient cooperative and oriented Pain management: pain level controlled Vital Signs Assessment: post-procedure vital signs reviewed and stable Respiratory status: spontaneous breathing, nonlabored ventilation and respiratory function stable Cardiovascular status: blood pressure returned to baseline and stable Postop Assessment: no apparent nausea or vomiting Anesthetic complications: no   There were no known notable events for this encounter.  Last Vitals:  Vitals:   02/03/23 1300 02/03/23 1328  BP: 137/89 (!) 136/100  Pulse: 82 87  Resp: 15 20  Temp: 36.5 C 36.4 C  SpO2: 94% 98%    Last Pain:  Vitals:   02/03/23 1330  TempSrc:   PainSc: 0-No pain                 Shawntel Farnworth,E. Allyna Pittsley

## 2023-02-03 NOTE — Op Note (Signed)
Brief history: The patient is a 67 year old white male who complains of back and bilateral leg pain consistent with neurogenic claudication.  He has failed medical management and was worked up with a lumbar MRI and lumbar x-rays which demonstrated generative changes and L3-4 spondylolisthesis with spinal stenosis.  I discussed the various treatment options with him.  He has decided to proceed with surgery.  Preoperative diagnosis: L3-4 spondylolisthesis, spinal stenosis compressing both the L3 and the L4 nerve roots; lumbago; lumbar radiculopathy; neurogenic claudication  Postoperative diagnosis: The same  Procedure: Bilateral L3-4 laminotomy/foraminotomies/medial facetectomy to decompress the bilateral L3 and L4 nerve roots(the work required to do this was in addition to the work required to do the posterior lumbar interbody fusion because of the patient's spinal stenosis, facet arthropathy. Etc. requiring a wide decompression of the nerve roots.);  Left L3-4 transforaminal lumbar interbody fusion with local morselized autograft bone and Zimmer DBM; insertion of interbody prosthesis at L3-4 (globus peek expandable interbody prosthesis); posterior nonsegmental instrumentation from L3 to L4 with globus titanium pedicle screws and rods; posterior lateral arthrodesis at L3-4 with local morselized autograft bone and Zimmer DBM.  Surgeon: Dr. Delma Officer  Asst.: Dr. Barnett Abu and Hildred Priest, NP  Anesthesia: Gen. endotracheal  Estimated blood loss: 250 cc  Drains: None  Complications: None  Description of procedure: The patient was brought to the operating room by the anesthesia team. General endotracheal anesthesia was induced. The patient was turned to the prone position on the Wilson frame. The patient's lumbosacral region was then prepared with Betadine scrub and Betadine solution. Sterile drapes were applied.  I then injected the area to be incised with Marcaine with epinephrine solution.  I then used the scalpel to make a linear midline incision over the L3-4 interspace. I then used electrocautery to perform a bilateral subperiosteal dissection exposing the spinous process and lamina of L3-4. We then obtained intraoperative radiograph to confirm our location. We then inserted the Verstrac retractor to provide exposure.  I began the decompression by using the high speed drill to perform laminotomies at L3-4 bilaterally. We then used the Kerrison punches to widen the laminotomy and removed the ligamentum flavum at L3-4 bilaterally. We used the Kerrison punches to remove the medial facets at L3-4 bilaterally, we removed the left L3-4 facet. We performed wide foraminotomies about the bilateral L3 and L4 nerve roots completing the decompression.  We now turned our attention to the posterior lumbar interbody fusion. I used a scalpel to incise the intervertebral disc at L3-4 bilaterally. I then performed a partial intervertebral discectomy at L3-4 bilaterally using the pituitary forceps. We prepared the vertebral endplates at L3-4 bilaterally for the fusion by removing the soft tissues with the curettes. We then used the trial spacers to pick the appropriate sized interbody prosthesis. We prefilled his prosthesis with a combination of local morselized autograft bone that we obtained during the decompression as well as Zimmer DBM. We inserted the prefilled prosthesis into the interspace at L3-4 from the left, we then turned and expanded the prosthesis. There was a good snug fit of the prosthesis in the interspace. We then filled and the remainder of the intervertebral disc space with local morselized autograft bone and Zimmer DBM. This completed the posterior lumbar interbody arthrodesis.  During the decompression and insertion of the prosthesis the assistant protected the thecal sac and nerve roots with the D'Errico retractor.  We now turned attention to the instrumentation. Under fluoroscopic  guidance we cannulated the bilateral L3  and L4 pedicles with the bone probe. We then removed the bone probe. We then tapped the pedicle with a 6.5 millimeter tap. We then removed the tap. We probed inside the tapped pedicle with a ball probe to rule out cortical breaches. We then inserted a 7.5 x 55 millimeter pedicle screw into the L3 and L4 pedicles bilaterally under fluoroscopic guidance. We then palpated along the medial aspect of the pedicles to rule out cortical breaches. There were none. The nerve roots were not injured. We then connected the unilateral pedicle screws with a lordotic rod. We compressed the construct and secured the rod in place with the caps. We then tightened the caps appropriately. This completed the instrumentation from L3-4 bilaterally.  We now turned our attention to the posterior lateral arthrodesis at L3-4. We used the high-speed drill to decorticate the remainder of the facets, pars, transverse process at L3-4. We then applied a combination of local morselized autograft bone and Zimmer DBM over these decorticated posterior lateral structures. This completed the posterior lateral arthrodesis.  We then obtained hemostasis using bipolar electrocautery. We irrigated the wound out with Betadine solution. We inspected the thecal sac and nerve roots and noted they were well decompressed. We then removed the retractor.  We injected Exparel . We reapproximated patient's thoracolumbar fascia with interrupted #1 Vicryl suture. We reapproximated patient's subcutaneous tissue with interrupted 2-0 Vicryl suture. The reapproximated patient's skin with Steri-Strips and benzoin. The wound was then coated with bacitracin ointment. A sterile dressing was applied. The drapes were removed. The patient was subsequently returned to the supine position where they were extubated by the anesthesia team. He was then transported to the post anesthesia care unit in stable condition. All sponge instrument and  needle counts were reportedly correct at the end of this case.
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
## 2023-02-03 NOTE — Transfer of Care (Signed)
Immediate Anesthesia Transfer of Care Note  Patient: Paul Hinton  Procedure(s) Performed: POSTERIOR LUMBAR INTERBODY FUSION,INTERBODY PROSTHESIS,POSTERIOR INSTRUMENTATION, LUMBAR THREE-FOUR  Patient Location: PACU  Anesthesia Type:General  Level of Consciousness: drowsy and patient cooperative  Airway & Oxygen Therapy: Patient connected to face mask oxygen  Post-op Assessment: Report given to RN, Post -op Vital signs reviewed and stable, and Patient moving all extremities X 4  Post vital signs: Reviewed and stable  Last Vitals:  Vitals Value Taken Time  BP 142/81 02/03/23 1219  Temp    Pulse 89 02/03/23 1223  Resp 14 02/03/23 1223  SpO2 98 % 02/03/23 1223  Vitals shown include unfiled device data.  Last Pain:  Vitals:   02/03/23 0559  TempSrc:   PainSc: 0-No pain      Patients Stated Pain Goal: 0 (02/03/23 0559)  Complications: There were no known notable events for this encounter.

## 2023-02-04 DIAGNOSIS — M5416 Radiculopathy, lumbar region: Secondary | ICD-10-CM | POA: Diagnosis not present

## 2023-02-04 DIAGNOSIS — M4316 Spondylolisthesis, lumbar region: Secondary | ICD-10-CM | POA: Diagnosis not present

## 2023-02-04 DIAGNOSIS — Z7982 Long term (current) use of aspirin: Secondary | ICD-10-CM | POA: Diagnosis not present

## 2023-02-04 DIAGNOSIS — Z79899 Other long term (current) drug therapy: Secondary | ICD-10-CM | POA: Diagnosis not present

## 2023-02-04 DIAGNOSIS — Z8673 Personal history of transient ischemic attack (TIA), and cerebral infarction without residual deficits: Secondary | ICD-10-CM | POA: Diagnosis not present

## 2023-02-04 DIAGNOSIS — Z87891 Personal history of nicotine dependence: Secondary | ICD-10-CM | POA: Diagnosis not present

## 2023-02-04 DIAGNOSIS — M48062 Spinal stenosis, lumbar region with neurogenic claudication: Secondary | ICD-10-CM | POA: Diagnosis not present

## 2023-02-04 MED ORDER — ACETAMINOPHEN 500 MG PO TABS
1000.0000 mg | ORAL_TABLET | Freq: Four times a day (QID) | ORAL | Status: AC
  Administered 2023-02-04 – 2023-02-05 (×4): 1000 mg via ORAL
  Filled 2023-02-04 (×4): qty 2

## 2023-02-04 MED ORDER — CEPHALEXIN 500 MG PO CAPS
500.0000 mg | ORAL_CAPSULE | Freq: Four times a day (QID) | ORAL | Status: DC
  Administered 2023-02-04 – 2023-02-06 (×8): 500 mg via ORAL
  Filled 2023-02-04 (×9): qty 1

## 2023-02-04 NOTE — Discharge Instructions (Signed)

## 2023-02-04 NOTE — Discharge Summary (Signed)
Physician Discharge Summary     Providing Compassionate, Quality Care - Together   Patient ID: Paul Hinton MRN: [id number] DOB/AGE: Jan 01, 1956 67 y.o.  Admit date: 02/03/2023 Discharge date: 02/04/2023  Admission Diagnoses: Spondylolisthesis of lumbar region  Discharge Diagnoses:  Principal Problem:   Spondylolisthesis of lumbar region   Discharged Condition: good  Hospital Course: Patient underwent an L3-4 fusion by Dr. Lovell Sheehan on 02/03/2023. He was admitted to 3C03 following recovery from anesthesia in the PACU. His postoperative course has been uncomplicated aside from some sanguinous wound drainage. The patient was started on cephalexin while admitted and will be discharged on a one week supply. He has worked with both physical and occupational therapies who feel the patient is ready for discharge home. He is ambulating independently and without difficulty. He is tolerating a normal diet. He is not having any bowel or bladder dysfunction. His pain is well-controlled with oral pain medication. He is ready for discharge home.   Consults: PT/OT/TOC  Significant Diagnostic Studies: radiology: DG Lumbar Spine 2-3 Views  Result Date: 02/03/2023 CLINICAL DATA:  Elective surgery EXAM: LUMBAR SPINE - 2-3 VIEW COMPARISON:  None Available. FINDINGS: Two fluoroscopic spot views of the lumbar spine obtained in the operating room. Pedicle screws with interbody spacer at L3 and L4. Fluoroscopy time 14 seconds. Dose 13.43 mGy IMPRESSION: Intraoperative fluoroscopy during lumbar fusion. Electronically Signed   By: Narda Rutherford M.D.   On: 02/03/2023 15:17   DG Lumbar Spine 2-3 Views  Result Date: 02/03/2023 CLINICAL DATA:  Intraoperative localization EXAM: LUMBAR SPINE - 2-3 VIEW COMPARISON:  MRI 10/06/2022 FINDINGS: Limited lateral intraoperative views of the lumbar spine. Image obtained at 08:39 demonstrates surgical instruments and curvilinear opacity overlying the spinous process of L3.  Subsequent image obtained at 08:55 demonstrates linear localizing instruments overlying the posterior elements at the L3-L4 level. IMPRESSION: Limited intraoperative views of the lumbar spine for surgical localization. Electronically Signed   By: Jasmine Pang M.D.   On: 02/03/2023 15:14   DG C-Arm 1-60 Min-No Report  Result Date: 02/03/2023 Fluoroscopy was utilized by the requesting physician.  No radiographic interpretation.     Treatments: surgery: Bilateral L3-4 laminotomy/foraminotomies/medial facetectomy to decompress the bilateral L3 and L4 nerve roots(the work required to do this was in addition to the work required to do the posterior lumbar interbody fusion because of the patient's spinal stenosis, facet arthropathy. Etc. requiring a wide decompression of the nerve roots.);  Left L3-4 transforaminal lumbar interbody fusion with local morselized autograft bone and Zimmer DBM; insertion of interbody prosthesis at L3-4 (globus peek expandable interbody prosthesis); posterior nonsegmental instrumentation from L3 to L4 with globus titanium pedicle screws and rods; posterior lateral arthrodesis at L3-4 with local morselized autograft bone and Zimmer DBM.   Discharge Exam: Blood pressure 124/77, pulse 76, temperature 98.4 F (36.9 C), temperature source Oral, resp. rate 18, height 5\' 10"  (1.778 m), weight 131.5 kg, SpO2 98%.  Per report: Alert and oriented x 4 PERRLA CN II-XII grossly intact MAE, Strength and sensation intact Incision is covered with Honeycomb dressing and Steri Strips; Dressing is clean, dry, and intact   Disposition:    Allergies as of 02/06/2023       Reactions   Meperidine Other (See Comments)   hallucinations   Penicillin G Other (See Comments)   No longer effective after having multiple doses at age 40 for meningitis        Medication List     TAKE these medications  anastrozole 1 MG tablet Commonly known as: ARIMIDEX Take 1 mg by mouth every  Monday, Wednesday, and Friday.   aspirin EC 81 MG tablet Take 81 mg by mouth daily.   atorvastatin 40 MG tablet Commonly known as: LIPITOR Take 40 mg by mouth at bedtime.   cephALEXin 500 MG capsule Commonly known as: KEFLEX Take 1 capsule (500 mg total) by mouth every 6 (six) hours.   cholecalciferol 25 MCG (1000 UNIT) tablet Commonly known as: VITAMIN D3 Take 1,000 Units by mouth daily.   cyclobenzaprine 5 MG tablet Commonly known as: FLEXERIL Take 1-2 tablets (5-10 mg total) by mouth 3 (three) times daily as needed for muscle spasms.   docusate sodium 100 MG capsule Commonly known as: COLACE Take 1 capsule (100 mg total) by mouth 2 (two) times daily.   fexofenadine 180 MG tablet Commonly known as: ALLEGRA Take 180 mg by mouth daily.   LUBRICATING EYE DROPS OP Place 1 drop into both eyes daily as needed (dry eyes).   oxyCODONE-acetaminophen 5-325 MG tablet Commonly known as: PERCOCET/ROXICET Take 1-2 tablets by mouth every 4 (four) hours as needed for severe pain.   testosterone cypionate 200 MG/ML injection Commonly known as: DEPOTESTOSTERONE CYPIONATE Inject 60 mg into the muscle every Saturday.        Follow-up Information     Tressie Stalker, MD Follow up on 02/25/2023.   Specialty: Neurosurgery Why: First post op appointment is on 02/25/2023 at 8:30 AM. Contact information: [address] [place] [address] [phone]                 Signed: Val Eagle, DNP, AGNP-C Nurse Practitioner  [facility] [address], [place], [place] [address] P: [phone]    F: [phone]  02/04/2023, 10:41 AM

## 2023-02-04 NOTE — Progress Notes (Signed)
Subjective: The patient is alert and pleasant.  He looks well.  He wants to go home.  Objective: Vital signs in last 24 hours: Temp:  [97.6 F (36.4 C)-98.8 F (37.1 C)] 98.4 F (36.9 C) (07/23 0725) Pulse Rate:  [73-94] 76 (07/23 0725) Resp:  [14-20] 18 (07/23 0725) BP: (116-143)/(54-100) 124/77 (07/23 0725) SpO2:  [94 %-99 %] 98 % (07/23 0725) Estimated body mass index is 41.61 kg/m as calculated from the following:   Height as of this encounter: 5\' 10"  (1.778 m).   Weight as of this encounter: 131.5 kg.   Intake/Output from previous day: 07/22 0701 - 07/23 0700 In: 4015 [P.O.:1440; I.V.:2000; Blood:125; IV Piggyback:450] Out: 651 [Urine:401; Blood:250] Intake/Output this shift: No intake/output data recorded.  Physical exam the patient is alert and oriented.  His strength is normal.  His dressing is clean and dry.  It was changed recently.  Lab Results: No results for input(s): "WBC", "HGB", "HCT", "PLT" in the last 72 hours. BMET No results for input(s): "NA", "K", "CL", "CO2", "GLUCOSE", "BUN", "CREATININE", "CALCIUM" in the last 72 hours.  Studies/Results: DG Lumbar Spine 2-3 Views  Result Date: 02/03/2023 CLINICAL DATA:  Elective surgery EXAM: LUMBAR SPINE - 2-3 VIEW COMPARISON:  None Available. FINDINGS: Two fluoroscopic spot views of the lumbar spine obtained in the operating room. Pedicle screws with interbody spacer at L3 and L4. Fluoroscopy time 14 seconds. Dose 13.43 mGy IMPRESSION: Intraoperative fluoroscopy during lumbar fusion. Electronically Signed   By: Narda Rutherford M.D.   On: 02/03/2023 15:17   DG Lumbar Spine 2-3 Views  Result Date: 02/03/2023 CLINICAL DATA:  Intraoperative localization EXAM: LUMBAR SPINE - 2-3 VIEW COMPARISON:  MRI 10/06/2022 FINDINGS: Limited lateral intraoperative views of the lumbar spine. Image obtained at 08:39 demonstrates surgical instruments and curvilinear opacity overlying the spinous process of L3. Subsequent image obtained  at 08:55 demonstrates linear localizing instruments overlying the posterior elements at the L3-L4 level. IMPRESSION: Limited intraoperative views of the lumbar spine for surgical localization. Electronically Signed   By: Jasmine Pang M.D.   On: 02/03/2023 15:14   DG C-Arm 1-60 Min-No Report  Result Date: 02/03/2023 Fluoroscopy was utilized by the requesting physician.  No radiographic interpretation.    Assessment/Plan: Postop day #1: The patient is doing well.  He will likely go home after PT.    I gave him his discharge instructions and answered all his questions.  Wound drainage: I will start preventative Keflex.  Hopefully we can get a waterproof bandage on him before he leaves.  I instructed him on dressing changes as necessary.  LOS: 0 days     Cristi Loron 02/04/2023, 7:59 AM     Patient ID: Paul Hinton, male   DOB: 08-28-1955, 67 y.o.   MRN: [id number]

## 2023-02-04 NOTE — Evaluation (Addendum)
Occupational Therapy Evaluation Patient Details Name: Paul Hinton MRN: [id number] DOB: 1956/07/10 Today's Date: 02/04/2023   History of Present Illness 67 y.o. male presents to [facility] hospital on 02/03/2023 for L3-4 decompression and TLIF. PMH includes anxiety, OA, depression, HLD, OSA, PFO, polycythemia, CVA.   Clinical Impression   Patient evaluated by Occupational Therapy with no further acute OT needs identified. All education has been completed and the patient has no further questions. Pt with education repeated about use of AE and sitting to decrease fall risk for LB dressing. Pt provided all information for AE on handouts. Pt expressed interest in water based PT in the future. Pt educated on [facility] location at [facility] offers PT in the water. See below for any follow-up Occupational Therapy or equipment needs. OT to sign off. Thank you for referral.        Recommendations for follow up therapy are one component of a multi-disciplinary discharge planning process, led by the attending physician.  Recommendations may be updated based on patient status, additional functional criteria and insurance authorization.   Assistance Recommended at Discharge None  Patient can return home with the following Assist for transportation    Functional Status Assessment  Patient has had a recent decline in their functional status and demonstrates the ability to make significant improvements in function in a reasonable and predictable amount of time.  Equipment Recommendations  BSC/3in1 (needs wide / oval toilet due to body habitus)    Recommendations for Other Services       Precautions / Restrictions Precautions Precautions: Back Precaution Booklet Issued: Yes (comment) Precaution Comments: back precautions reviewed for adls. Required Braces or Orthoses: Spinal Brace Spinal Brace: Lumbar corset;Applied in standing position Restrictions Weight Bearing Restrictions: No      Mobility Bed  Mobility Overal bed mobility: Modified Independent             General bed mobility comments: return to supine    Transfers Overall transfer level: Independent Equipment used: None                      Balance Overall balance assessment: No apparent balance deficits (not formally assessed)                                         ADL either performed or assessed with clinical judgement   ADL Overall ADL's : Modified independent                                       General ADL Comments: educated on use of sock aide with return demo. recommendation for extra wide sock aide, discussed toilet aides, hello tushy adapter and bSC vs toilet riser. pt advised with elongated vs circle commode urinal needs. pt progressed to dressed in session for home. pt with top not changed due to dressing change at wound needed prior. Dressing is dry but needs wet/dry honey comb dressing for showering. handouts for AE and back provided   Back handout provided and reviewed adls in detail. Pt educated on: clothing between brace, never sleep in brace, set an alarm at night for medication, avoid sitting for long periods of time, correct bed positioning for sleeping, correct sequence for bed mobility, avoiding lifting more than 5 pounds and never wash directly over incision. All  education is complete and patient indicates understanding.   Vision Baseline Vision/History: 1 Wears glasses Ability to See in Adequate Light: 0 Adequate Vision Assessment?: No apparent visual deficits     Perception     Praxis      Pertinent Vitals/Pain Pain Assessment Pain Assessment: Faces Faces Pain Scale: Hurts a little bit Pain Location: back Pain Descriptors / Indicators: Sore Pain Intervention(s): Monitored during session     Hand Dominance Right   Extremity/Trunk Assessment Upper Extremity Assessment Upper Extremity Assessment: Overall WFL for tasks assessed   Lower  Extremity Assessment Lower Extremity Assessment: Overall WFL for tasks assessed   Cervical / Trunk Assessment Cervical / Trunk Assessment: Back Surgery   Communication Communication Communication: No difficulties   Cognition Arousal/Alertness: Awake/alert Behavior During Therapy: WFL for tasks assessed/performed Overall Cognitive Status: Within Functional Limits for tasks assessed                                 General Comments: needed cues and repetition of safety with dressing from seated positiong     General Comments  RA    Exercises     Shoulder Instructions      Home Living Family/patient expects to be discharged to:: Private residence Living Arrangements: Spouse/significant other Available Help at Discharge: Friend(s);Family Type of Home: House Home Access: Stairs to enter [employer] of Steps: 1 Entrance Stairs-Rails: None Home Layout: Two level Alternate Level Stairs-Number of Steps: flight   Bathroom Shower/Tub: Producer, television/film/video: Standard     Home Equipment: Cane - single point;Shower seat - built in   Additional Comments: wife at home to help      Prior Functioning/Environment Prior Level of Function : Independent/Modified Independent;Driving             Mobility Comments: ambulatory without DME ADLs Comments: indep . dresses in standing        OT Problem List:        OT Treatment/Interventions:      OT Goals(Current goals can be found in the care plan section) Acute Rehab OT Goals Potential to Achieve Goals: Good  OT Frequency:      Co-evaluation              AM-PAC OT "6 Clicks" Daily Activity     Outcome Measure Help from another person eating meals?: None Help from another person taking care of personal grooming?: None Help from another person toileting, which includes using toliet, bedpan, or urinal?: None Help from another person bathing (including washing, rinsing, drying)?:  None Help from another person to put on and taking off regular upper body clothing?: None Help from another person to put on and taking off regular lower body clothing?: None 6 Click Score: 24   End of Session Equipment Utilized During Treatment: Back brace Nurse Communication: Mobility status;Precautions  Activity Tolerance: Patient tolerated treatment well Patient left: in bed;with call bell/phone within reach  OT Visit Diagnosis: Unsteadiness on feet (R26.81)                Time: 1610-9604 OT Time Calculation (min): 18 min Charges:  OT General Charges $OT Visit: 1 Visit OT Evaluation $OT Eval Moderate Complexity: 1 Mod   Brynn, OTR/L  Acute Rehabilitation Services Office: [phone] .   Mateo Flow 02/04/2023, 11:20 AM

## 2023-02-04 NOTE — Evaluation (Signed)
Physical Therapy Evaluation Patient Details Name: Paul Hinton MRN: [id number] DOB: 01-Dec-1955 Today's Date: 02/04/2023  History of Present Illness  67 y.o. male presents to [facility] hospital on 02/03/2023 for L3-4 decompression and TLIF. PMH includes anxiety, OA, depression, HLD, OSA, PFO, polycythemia, CVA.  Clinical Impression  Pt presents to PT s/p L3-4 TLIF mobilizing well. Pt is able to perform bed mobility, transfer, ambulate, and negotiate stairs at a modI level, requiring increased time. PT provides education on back precautions during session. PT encourages frequent mobilization in an effort to improve endurance and restore independence. PT recommends discharge home when medically appropriate.        Assistance Recommended at Discharge PRN  If plan is discharge home, recommend the following:  Can travel by private vehicle  A little help with bathing/dressing/bathroom        Equipment Recommendations None recommended by PT  Recommendations for Other Services       Functional Status Assessment Patient has had a recent decline in their functional status and demonstrates the ability to make significant improvements in function in a reasonable and predictable amount of time.     Precautions / Restrictions Precautions Precautions: Back Precaution Booklet Issued: Yes (comment) Precaution Comments: back precautions reviewed Required Braces or Orthoses: Spinal Brace Spinal Brace: Lumbar corset;Applied in standing position Restrictions Weight Bearing Restrictions: No      Mobility  Bed Mobility Overal bed mobility: Modified Independent             General bed mobility comments: roll to sidelying to sitting, use of railing and increased time    Transfers Overall transfer level: Independent Equipment used: None                    Ambulation/Gait Ambulation/Gait assistance: Modified independent (Device/Increase time) Gait Distance (Feet): 400 Feet Assistive  device: None Gait Pattern/deviations: WFL(Within Functional Limits) Gait velocity: functional Gait velocity interpretation: >2.62 ft/sec, indicative of community ambulatory   General Gait Details: steady step-through gait  Stairs Stairs: Yes Stairs assistance: Modified independent (Device/Increase time) Stair Management: One rail Right, Alternating pattern, Forwards Number of Stairs: 10    Wheelchair Mobility     Tilt Bed    Modified Rankin (Stroke Patients Only)       Balance Overall balance assessment: No apparent balance deficits (not formally assessed)                                           Pertinent Vitals/Pain Pain Assessment Pain Assessment: Faces Faces Pain Scale: Hurts a little bit Pain Location: back Pain Descriptors / Indicators: Sore Pain Intervention(s): Monitored during session    Home Living Family/patient expects to be discharged to:: Private residence Living Arrangements: Spouse/significant other Available Help at Discharge: Friend(s);Family Type of Home: House Home Access: Stairs to enter Entrance Stairs-Rails: None Entrance Stairs-Number of Steps: 1 Alternate Level Stairs-Number of Steps: flight Home Layout: Two level Home Equipment: Cane - single point;Shower seat - built in      Prior Function Prior Level of Function : Independent/Modified Independent;Driving             Mobility Comments: ambulatory without DME       Hand Dominance        Extremity/Trunk Assessment   Upper Extremity Assessment Upper Extremity Assessment: Overall WFL for tasks assessed    Lower Extremity Assessment Lower Extremity Assessment: Overall  WFL for tasks assessed    Cervical / Trunk Assessment Cervical / Trunk Assessment: Back Surgery  Communication   Communication: No difficulties  Cognition Arousal/Alertness: Awake/alert Behavior During Therapy: WFL for tasks assessed/performed Overall Cognitive Status: Within  Functional Limits for tasks assessed                                          General Comments General comments (skin integrity, edema, etc.): VSS on RA    Exercises     Assessment/Plan    PT Assessment Patient does not need any further PT services  PT Problem List         PT Treatment Interventions      PT Goals (Current goals can be found in the Care Plan section)       Frequency       Co-evaluation               AM-PAC PT "6 Clicks" Mobility  Outcome Measure Help needed turning from your back to your side while in a flat bed without using bedrails?: None Help needed moving from lying on your back to sitting on the side of a flat bed without using bedrails?: None Help needed moving to and from a bed to a chair (including a wheelchair)?: None Help needed standing up from a chair using your arms (e.g., wheelchair or bedside chair)?: None Help needed to walk in hospital room?: None Help needed climbing 3-5 steps with a railing? : None 6 Click Score: 24    End of Session Equipment Utilized During Treatment: Back brace Activity Tolerance: Patient tolerated treatment well Patient left:  (in hallway with OT) Nurse Communication: Mobility status PT Visit Diagnosis: Other abnormalities of gait and mobility (R26.89)    Time: 1610-9604 PT Time Calculation (min) (ACUTE ONLY): 16 min   Charges:   PT Evaluation $PT Eval Low Complexity: 1 Low   PT General Charges $$ ACUTE PT VISIT: 1 Visit         Arlyss Gandy, PT, DPT Acute Rehabilitation Office [phone]   Arlyss Gandy 02/04/2023, 10:57 AM

## 2023-02-04 NOTE — Plan of Care (Signed)

## 2023-02-05 DIAGNOSIS — Z87891 Personal history of nicotine dependence: Secondary | ICD-10-CM | POA: Diagnosis not present

## 2023-02-05 DIAGNOSIS — Z8673 Personal history of transient ischemic attack (TIA), and cerebral infarction without residual deficits: Secondary | ICD-10-CM | POA: Diagnosis not present

## 2023-02-05 DIAGNOSIS — M4316 Spondylolisthesis, lumbar region: Secondary | ICD-10-CM | POA: Diagnosis not present

## 2023-02-05 DIAGNOSIS — M48062 Spinal stenosis, lumbar region with neurogenic claudication: Secondary | ICD-10-CM | POA: Diagnosis not present

## 2023-02-05 DIAGNOSIS — M5416 Radiculopathy, lumbar region: Secondary | ICD-10-CM | POA: Diagnosis not present

## 2023-02-05 DIAGNOSIS — Z79899 Other long term (current) drug therapy: Secondary | ICD-10-CM | POA: Diagnosis not present

## 2023-02-05 DIAGNOSIS — Z7982 Long term (current) use of aspirin: Secondary | ICD-10-CM | POA: Diagnosis not present

## 2023-02-05 MED ORDER — CYCLOBENZAPRINE HCL 5 MG PO TABS
5.0000 mg | ORAL_TABLET | Freq: Three times a day (TID) | ORAL | Status: DC | PRN
  Administered 2023-02-05: 5 mg via ORAL
  Filled 2023-02-05: qty 1

## 2023-02-05 MED ORDER — OXYCODONE-ACETAMINOPHEN 5-325 MG PO TABS
1.0000 | ORAL_TABLET | ORAL | Status: DC | PRN
  Administered 2023-02-05: 2 via ORAL
  Filled 2023-02-05: qty 2

## 2023-02-05 MED ORDER — BISACODYL 5 MG PO TBEC
5.0000 mg | DELAYED_RELEASE_TABLET | Freq: Every day | ORAL | Status: DC | PRN
  Administered 2023-02-05: 5 mg via ORAL
  Filled 2023-02-05: qty 1

## 2023-02-05 NOTE — Progress Notes (Signed)
Subjective: The patient is alert and pleasant.  His dressing was changed secondary to bloody discharge.  Objective: Vital signs in last 24 hours: Temp:  [98.1 F (36.7 C)-99.2 F (37.3 C)] 98.9 F (37.2 C) (07/24 0732) Pulse Rate:  [66-93] 93 (07/24 0732) Resp:  [16-18] 18 (07/24 0732) BP: (119-146)/(65-84) 132/80 (07/24 0732) SpO2:  [95 %-100 %] 95 % (07/24 0732) Estimated body mass index is 41.61 kg/m as calculated from the following:   Height as of this encounter: 5\' 10"  (1.778 m).   Weight as of this encounter: 131.5 kg.   Intake/Output from previous day: 07/23 0701 - 07/24 0700 In: 720 [P.O.:720] Out: -  Intake/Output this shift: No intake/output data recorded.  Physical exam the patient is alert and pleasant.  His strength is normal.  I took down his fresh dressing which was clean and dry.  The wound edges looked good.  I did not see any bleeding points.  I could not express any blood.  Lab Results: No results for input(s): "WBC", "HGB", "HCT", "PLT" in the last 72 hours. BMET No results for input(s): "NA", "K", "CL", "CO2", "GLUCOSE", "BUN", "CREATININE", "CALCIUM" in the last 72 hours.  Studies/Results: DG Lumbar Spine 2-3 Views  Result Date: 02/03/2023 CLINICAL DATA:  Elective surgery EXAM: LUMBAR SPINE - 2-3 VIEW COMPARISON:  None Available. FINDINGS: Two fluoroscopic spot views of the lumbar spine obtained in the operating room. Pedicle screws with interbody spacer at L3 and L4. Fluoroscopy time 14 seconds. Dose 13.43 mGy IMPRESSION: Intraoperative fluoroscopy during lumbar fusion. Electronically Signed   By: Narda Rutherford M.D.   On: 02/03/2023 15:17   DG C-Arm 1-60 Min-No Report  Result Date: 02/03/2023 Fluoroscopy was utilized by the requesting physician.  No radiographic interpretation.    Assessment/Plan: Postop day #2: The patient is doing well except as below.  Wound drainage: We will observe this.  If there is any bleeding points identified we can  place a staple.  Perhaps he can go home later on today on Keflex.  LOS: 0 days     Cristi Loron 02/05/2023, 9:37 AM     Patient ID: Marcelino Scot, male   DOB: 10-14-1955, 67 y.o.   MRN: [id number]

## 2023-02-06 DIAGNOSIS — Z87891 Personal history of nicotine dependence: Secondary | ICD-10-CM | POA: Diagnosis not present

## 2023-02-06 DIAGNOSIS — M5416 Radiculopathy, lumbar region: Secondary | ICD-10-CM | POA: Diagnosis not present

## 2023-02-06 DIAGNOSIS — Z7982 Long term (current) use of aspirin: Secondary | ICD-10-CM | POA: Diagnosis not present

## 2023-02-06 DIAGNOSIS — Z8673 Personal history of transient ischemic attack (TIA), and cerebral infarction without residual deficits: Secondary | ICD-10-CM | POA: Diagnosis not present

## 2023-02-06 DIAGNOSIS — Z79899 Other long term (current) drug therapy: Secondary | ICD-10-CM | POA: Diagnosis not present

## 2023-02-06 DIAGNOSIS — M4316 Spondylolisthesis, lumbar region: Secondary | ICD-10-CM | POA: Diagnosis not present

## 2023-02-06 DIAGNOSIS — M48062 Spinal stenosis, lumbar region with neurogenic claudication: Secondary | ICD-10-CM | POA: Diagnosis not present

## 2023-02-06 MED ORDER — DOCUSATE SODIUM 100 MG PO CAPS: 100.0000 mg | ORAL_CAPSULE | Freq: Two times a day (BID) | ORAL | 0 refills | Status: DC

## 2023-02-06 MED ORDER — OXYCODONE-ACETAMINOPHEN 5-325 MG PO TABS: 1.0000 | ORAL_TABLET | ORAL | 0 refills | Status: DC | PRN

## 2023-02-06 MED ORDER — CYCLOBENZAPRINE HCL 5 MG PO TABS: 5.0000 mg | ORAL_TABLET | Freq: Three times a day (TID) | ORAL | 0 refills | Status: DC | PRN

## 2023-02-06 MED ORDER — CEPHALEXIN 500 MG PO CAPS: 500.0000 mg | ORAL_CAPSULE | Freq: Four times a day (QID) | ORAL | 0 refills | Status: DC

## 2023-02-06 MED ORDER — OXYCODONE-ACETAMINOPHEN 5-325 MG PO TABS: 1.0000 | ORAL_TABLET | ORAL | Status: DC | PRN

## 2023-02-06 NOTE — Discharge Summary (Signed)
Physician Discharge Summary  Patient ID: Paul Hinton MRN: [id number] DOB/AGE: 1956/06/18 67 y.o.  Admit date: 02/03/2023 Discharge date: 02/06/2023  Admission Diagnoses: Lumbar spondylolisthesis, lumbar spinal stenosis, lumbar radiculopathy, neurogenic claudication, lumbago  Discharge Diagnoses: The same Principal Problem:   Spondylolisthesis of lumbar region   Discharged Condition: good  Hospital Course: I performed an L3-4 decompression, instrumentation and fusion on the patient on 02/03/2023.  The surgery went well.  The patient's postoperative course was remarkable only for some bloody discharge from his wound.  He was started empirically on Keflex.  This resolved on 02/05/2023.  On 02/06/2023 the patient felt well and requested discharge home.  He was given verbal and written discharge instructions.  All his questions were answered.  Consults: PT, care management Significant Diagnostic Studies: None Treatments: L3-4 decompression, instrumentation and fusion. Discharge Exam: Blood pressure 137/78, pulse 88, temperature 99.5 F (37.5 C), temperature source Oral, resp. rate 17, height 5\' 10"  (1.778 m), weight 131.5 kg, SpO2 95%. The patient is alert and pleasant.  He looks well.  His dressing is clean and dry.  His strength is normal.  He is ambulating well.  Disposition: Home  Discharge Instructions     Call MD for:  difficulty breathing, headache or visual disturbances   Complete by: As directed    Call MD for:  extreme fatigue   Complete by: As directed    Call MD for:  hives   Complete by: As directed    Call MD for:  persistant dizziness or light-headedness   Complete by: As directed    Call MD for:  persistant nausea and vomiting   Complete by: As directed    Call MD for:  redness, tenderness, or signs of infection (pain, swelling, redness, odor or green/yellow discharge around incision site)   Complete by: As directed    Call MD for:  severe uncontrolled pain    Complete by: As directed    Call MD for:  temperature >100.4   Complete by: As directed    Diet - low sodium heart healthy   Complete by: As directed    Discharge instructions   Complete by: As directed    Call [phone] for a followup appointment. Take a stool softener while you are using pain medications.   Driving Restrictions   Complete by: As directed    Do not drive for 2 weeks.   Increase activity slowly   Complete by: As directed    Lifting restrictions   Complete by: As directed    Do not lift more than 5 pounds. No excessive bending or twisting.   May shower / Bathe   Complete by: As directed    Remove the dressing for 3 days after surgery.  You may shower, but leave the incision alone.   Remove dressing in 48 hours   Complete by: As directed       Allergies as of 02/06/2023       Reactions   Meperidine Other (See Comments)   hallucinations   Penicillin G Other (See Comments)   No longer effective after having multiple doses at age 74 for meningitis        Medication List     TAKE these medications    anastrozole 1 MG tablet Commonly known as: ARIMIDEX Take 1 mg by mouth every Monday, Wednesday, and Friday.   aspirin EC 81 MG tablet Take 81 mg by mouth daily.   atorvastatin 40 MG tablet Commonly known as: LIPITOR Take  40 mg by mouth at bedtime.   cephALEXin 500 MG capsule Commonly known as: KEFLEX Take 1 capsule (500 mg total) by mouth every 6 (six) hours.   cholecalciferol 25 MCG (1000 UNIT) tablet Commonly known as: VITAMIN D3 Take 1,000 Units by mouth daily.   cyclobenzaprine 5 MG tablet Commonly known as: FLEXERIL Take 1-2 tablets (5-10 mg total) by mouth 3 (three) times daily as needed for muscle spasms.   docusate sodium 100 MG capsule Commonly known as: COLACE Take 1 capsule (100 mg total) by mouth 2 (two) times daily.   fexofenadine 180 MG tablet Commonly known as: ALLEGRA Take 180 mg by mouth daily.   LUBRICATING EYE DROPS  OP Place 1 drop into both eyes daily as needed (dry eyes).   oxyCODONE-acetaminophen 5-325 MG tablet Commonly known as: PERCOCET/ROXICET Take 1-2 tablets by mouth every 4 (four) hours as needed for severe pain.   testosterone cypionate 200 MG/ML injection Commonly known as: DEPOTESTOSTERONE CYPIONATE Inject 60 mg into the muscle every Saturday.        Follow-up Information     Tressie Stalker, MD Follow up on 02/25/2023.   Specialty: Neurosurgery Why: First post op appointment is on 02/25/2023 at 8:30 AM. Contact information: [address] [place] [address] [phone]                 Signed: Cristi Loron 02/06/2023, 8:28 AM

## 2023-02-06 NOTE — Progress Notes (Signed)
Patient alert and oriented, mae's well, voiding adequate amount of urine, swallowing without difficulty, no c/o pain at time of discharge. Patient discharged home with Spouse. Script and discharged instructions given to patient. Patient and spouse stated understanding of instructions given and dressing change as needed Patient has an appointment with Dr. Lovell Sheehan

## 2023-02-12 MED FILL — Heparin Sodium (Porcine) Inj 1000 Unit/ML: INTRAMUSCULAR | Qty: 30 | Status: AC

## 2023-02-12 MED FILL — Sodium Chloride IV Soln 0.9%: INTRAVENOUS | Qty: 2000 | Status: AC

## 2023-02-17 ENCOUNTER — Ambulatory Visit: Payer: Medicare Other

## 2023-02-17 ENCOUNTER — Other Ambulatory Visit (HOSPITAL_COMMUNITY): Payer: Medicare Other
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
## 2023-02-19 ENCOUNTER — Ambulatory Visit: Payer: Medicare Other | Admitting: Physician Assistant

## 2023-02-19 ENCOUNTER — Ambulatory Visit: Payer: Medicare Other | Attending: Cardiology

## 2023-02-19 ENCOUNTER — Ambulatory Visit: Payer: Medicare Other

## 2023-02-19 VITALS — BP 138/80 | HR 64 | Ht 70.0 in | Wt 286.0 lb

## 2023-02-19 DIAGNOSIS — Z8774 Personal history of (corrected) congenital malformations of heart and circulatory system: Secondary | ICD-10-CM | POA: Insufficient documentation

## 2023-02-19 DIAGNOSIS — Q2112 Patent foramen ovale: Secondary | ICD-10-CM | POA: Insufficient documentation

## 2023-02-19 DIAGNOSIS — I253 Aneurysm of heart: Secondary | ICD-10-CM | POA: Diagnosis not present

## 2023-02-19 LAB — ECHOCARDIOGRAM LIMITED BUBBLE STUDY

## 2023-02-19 NOTE — Patient Instructions (Signed)
Medication Instructions:  Your physician recommends that you continue on your current medications as directed. Please refer to the Current Medication list given to you today.  *If you need a refill on your cardiac medications before your next appointment, please call your pharmacy*   Lab Work: None ordered   If you have labs (blood work) drawn today and your tests are completely normal, you will receive your results only by: MyChart Message (if you have MyChart) OR A paper copy in the mail If you have any lab test that is abnormal or we need to change your treatment, we will call you to review the results.   Testing/Procedures: None ordered    Follow-Up: Follow up as needed   Other Instructions

## 2023-02-20 ENCOUNTER — Inpatient Hospital Stay: Admission: RE | Admit: 2023-02-20 | Payer: Medicare Other | Source: Ambulatory Visit

## 2023-02-24 DIAGNOSIS — R948 Abnormal results of function studies of other organs and systems: Secondary | ICD-10-CM | POA: Diagnosis not present

## 2023-02-24 DIAGNOSIS — E291 Testicular hypofunction: Secondary | ICD-10-CM | POA: Diagnosis not present

## 2023-02-25 DIAGNOSIS — M4316 Spondylolisthesis, lumbar region: Secondary | ICD-10-CM | POA: Diagnosis not present

## 2023-02-28 DIAGNOSIS — H5213 Myopia, bilateral: Secondary | ICD-10-CM | POA: Diagnosis not present

## 2023-03-03 DIAGNOSIS — R948 Abnormal results of function studies of other organs and systems: Secondary | ICD-10-CM | POA: Diagnosis not present

## 2023-03-03 DIAGNOSIS — E291 Testicular hypofunction: Secondary | ICD-10-CM | POA: Diagnosis not present

## 2023-03-03 DIAGNOSIS — R972 Elevated prostate specific antigen [PSA]: Secondary | ICD-10-CM | POA: Diagnosis not present

## 2023-03-10 DIAGNOSIS — E119 Type 2 diabetes mellitus without complications: Secondary | ICD-10-CM | POA: Diagnosis not present

## 2023-03-10 DIAGNOSIS — Z8774 Personal history of (corrected) congenital malformations of heart and circulatory system: Secondary | ICD-10-CM | POA: Diagnosis not present

## 2023-03-10 DIAGNOSIS — E1169 Type 2 diabetes mellitus with other specified complication: Secondary | ICD-10-CM | POA: Diagnosis not present

## 2023-03-10 DIAGNOSIS — Z6841 Body Mass Index (BMI) 40.0 and over, adult: Secondary | ICD-10-CM | POA: Diagnosis not present

## 2023-03-31 ENCOUNTER — Ambulatory Visit
Admission: RE | Admit: 2023-03-31 | Discharge: 2023-03-31 | Disposition: A | Discharge status: Home / Self Care | Payer: Medicare Other | Source: Ambulatory Visit | Attending: Internal Medicine

## 2023-03-31 DIAGNOSIS — N400 Enlarged prostate without lower urinary tract symptoms: Secondary | ICD-10-CM | POA: Diagnosis not present

## 2023-03-31 DIAGNOSIS — K639 Disease of intestine, unspecified: Secondary | ICD-10-CM | POA: Diagnosis not present

## 2023-03-31 DIAGNOSIS — Z1211 Encounter for screening for malignant neoplasm of colon: Secondary | ICD-10-CM | POA: Diagnosis not present

## 2023-03-31 DIAGNOSIS — Q438 Other specified congenital malformations of intestine: Secondary | ICD-10-CM

## 2023-03-31 DIAGNOSIS — K449 Diaphragmatic hernia without obstruction or gangrene: Secondary | ICD-10-CM | POA: Diagnosis not present

## 2023-04-30 DIAGNOSIS — K080 Exfoliation of teeth due to systemic causes: Secondary | ICD-10-CM | POA: Diagnosis not present

## 2023-05-14 DIAGNOSIS — Z6841 Body Mass Index (BMI) 40.0 and over, adult: Secondary | ICD-10-CM | POA: Diagnosis not present

## 2023-05-14 DIAGNOSIS — G4733 Obstructive sleep apnea (adult) (pediatric): Secondary | ICD-10-CM | POA: Diagnosis not present

## 2023-05-14 DIAGNOSIS — E66813 Obesity, class 3: Secondary | ICD-10-CM | POA: Diagnosis not present

## 2023-05-14 DIAGNOSIS — G4739 Other sleep apnea: Secondary | ICD-10-CM | POA: Diagnosis not present

## 2023-05-21 DIAGNOSIS — G4733 Obstructive sleep apnea (adult) (pediatric): Secondary | ICD-10-CM | POA: Diagnosis not present

## 2023-05-22 DIAGNOSIS — K080 Exfoliation of teeth due to systemic causes: Secondary | ICD-10-CM | POA: Diagnosis not present

## 2023-05-30 DIAGNOSIS — M4316 Spondylolisthesis, lumbar region: Secondary | ICD-10-CM | POA: Diagnosis not present

## 2023-06-10 DIAGNOSIS — J40 Bronchitis, not specified as acute or chronic: Secondary | ICD-10-CM | POA: Diagnosis not present

## 2023-06-20 DIAGNOSIS — G4733 Obstructive sleep apnea (adult) (pediatric): Secondary | ICD-10-CM | POA: Diagnosis not present

## 2023-06-25 DIAGNOSIS — K080 Exfoliation of teeth due to systemic causes: Secondary | ICD-10-CM | POA: Diagnosis not present

## 2023-06-26 ENCOUNTER — Other Ambulatory Visit: Payer: Self-pay | Admitting: Medical Genetics

## 2023-06-30 ENCOUNTER — Ambulatory Visit: Payer: Medicare Other | Attending: Cardiovascular Disease | Admitting: Cardiovascular Disease

## 2023-06-30 ENCOUNTER — Encounter: Payer: Self-pay | Admitting: Cardiovascular Disease

## 2023-06-30 VITALS — BP 122/76 | HR 62 | Ht 70.0 in | Wt 296.0 lb

## 2023-06-30 DIAGNOSIS — Z8774 Personal history of (corrected) congenital malformations of heart and circulatory system: Secondary | ICD-10-CM | POA: Diagnosis not present

## 2023-06-30 DIAGNOSIS — E782 Mixed hyperlipidemia: Secondary | ICD-10-CM

## 2023-06-30 DIAGNOSIS — I4891 Unspecified atrial fibrillation: Secondary | ICD-10-CM | POA: Diagnosis not present

## 2023-06-30 DIAGNOSIS — R072 Precordial pain: Secondary | ICD-10-CM | POA: Diagnosis not present

## 2023-06-30 DIAGNOSIS — Z8673 Personal history of transient ischemic attack (TIA), and cerebral infarction without residual deficits: Secondary | ICD-10-CM | POA: Diagnosis not present

## 2023-06-30 DIAGNOSIS — G4733 Obstructive sleep apnea (adult) (pediatric): Secondary | ICD-10-CM

## 2023-06-30 MED ORDER — METOPROLOL TARTRATE 50 MG PO TABS: 50.0000 mg | ORAL_TABLET | Freq: Once | ORAL | 0 refills | Status: AC

## 2023-06-30 NOTE — Patient Instructions (Signed)
Medication Instructions:  No changes *If you need a refill on your cardiac medications before your next appointment, please call your pharmacy*   Lab Work: BMP- today If you have labs (blood work) drawn today and your tests are completely normal, you will receive your results only by: MyChart Message (if you have MyChart) OR A paper copy in the mail If you have any lab test that is abnormal or we need to change your treatment, we will call you to review the results.   Testing/Procedures:   Your cardiac CT will be scheduled at one of the below locations:   [facility] [address] [place], [place] [address] [phone]  If scheduled at [facility], please arrive at the [facility] and Children's Entrance (Entrance C2) of [facility] 30 minutes prior to test start time. You can use the FREE valet parking offered at entrance C (encouraged to control the heart rate for the test)  Proceed to the [facility] Radiology Department (first floor) to check-in and test prep.  All radiology patients and guests should use entrance C2 at [facility], accessed from [facility], even though the hospital's physical address listed is [address]     Please follow these instructions carefully (unless otherwise directed):  An IV will be required for this test and Nitroglycerin will be given.  Hold all erectile dysfunction medications at least 3 days (72 hrs) prior to test. (Ie viagra, cialis, sildenafil, tadalafil, etc)   On the Night Before the Test: Be sure to Drink plenty of water. Do not consume any caffeinated/decaffeinated beverages or chocolate 12 hours prior to your test. Do not take any antihistamines 12 hours prior to your test.  On the Day of the Test: Drink plenty of water until 1 hour prior to the test. Do not eat any food 1 hour prior to test. You may take your regular medications prior to the test.  Take metoprolol  (Lopressor) two hours prior to test.  After the Test: Drink plenty of water. After receiving IV contrast, you may experience a mild flushed feeling. This is normal. On occasion, you may experience a mild rash up to 24 hours after the test. This is not dangerous. If this occurs, you can take Benadryl 25 mg and increase your fluid intake. If you experience trouble breathing, this can be serious. If it is severe call 911 IMMEDIATELY. If it is mild, please call our office.  We will call to schedule your test 2-4 weeks out understanding that some insurance companies will need an authorization prior to the service being performed.   For more information and frequently asked questions, please visit our website : [email or web address]  For non-scheduling related questions, please contact the cardiac imaging nurse navigator should you have any questions/concerns: Cardiac Imaging Nurse Navigators Direct Office Dial: [phone]   For scheduling needs, including cancellations and rescheduling, please call [place], [phone].    Follow-Up: At [facility], you and your health needs are our priority.  As part of our continuing mission to provide you with exceptional heart care, we have created designated Provider Care Teams.  These Care Teams include your primary Cardiologist (physician) and Advanced Practice Providers (APPs -  Physician Assistants and Nurse Practitioners) who all work together to provide you with the care you need, when you need it.  We recommend signing up for the patient portal called "MyChart".  Sign up information is provided on this After Visit Summary.  MyChart is used to connect with patients for Virtual Visits (Telemedicine).  Patients are able to view lab/test results, encounter notes, upcoming appointments, etc.  Non-urgent messages can be sent to your provider as well.   To learn more about what you can do with MyChart, go to [email or web address].     Your next appointment:   1 year(s)  Provider:   Thurmon Fair, MD
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
## 2023-07-01 LAB — BASIC METABOLIC PANEL
BUN/Creatinine Ratio: 16 (ref 10–24)
BUN: 17 mg/dL (ref 8–27)
CO2: 21 mmol/L (ref 20–29)
Calcium: 9.5 mg/dL (ref 8.6–10.2)
Chloride: 105 mmol/L (ref 96–106)
Creatinine, Ser: 1.05 mg/dL (ref 0.76–1.27)
Glucose: 90 mg/dL (ref 70–99)
Potassium: 4.6 mmol/L (ref 3.5–5.2)
Sodium: 145 mmol/L — ABNORMAL HIGH (ref 134–144)
eGFR: 77 mL/min/{1.73_m2} (ref >59)

## 2023-07-05 DIAGNOSIS — Z8774 Personal history of (corrected) congenital malformations of heart and circulatory system: Secondary | ICD-10-CM | POA: Insufficient documentation

## 2023-07-05 DIAGNOSIS — E782 Mixed hyperlipidemia: Secondary | ICD-10-CM | POA: Insufficient documentation

## 2023-07-05 DIAGNOSIS — Z8673 Personal history of transient ischemic attack (TIA), and cerebral infarction without residual deficits: Secondary | ICD-10-CM | POA: Insufficient documentation

## 2023-07-05 DIAGNOSIS — G4733 Obstructive sleep apnea (adult) (pediatric): Secondary | ICD-10-CM | POA: Insufficient documentation

## 2023-07-15 ENCOUNTER — Telehealth (HOSPITAL_COMMUNITY): Payer: Self-pay | Admitting: *Deleted

## 2023-07-15 NOTE — Telephone Encounter (Signed)
Reaching out to patient to offer assistance regarding upcoming cardiac imaging study; pt verbalizes understanding of appt date/time, parking situation and where to check in, pre-test NPO status and verified current allergies; name and call back number provided for further questions should they arise  Paul Brick RN Navigator Cardiac Imaging [facility] [phone] office [phone] cell  Patient aware to arrive at 7:30 AM.

## 2023-07-17 ENCOUNTER — Other Ambulatory Visit: Payer: Self-pay | Admitting: Cardiovascular Disease

## 2023-07-17 ENCOUNTER — Other Ambulatory Visit (HOSPITAL_BASED_OUTPATIENT_CLINIC_OR_DEPARTMENT_OTHER): Payer: Self-pay | Admitting: Cardiovascular Disease

## 2023-07-17 ENCOUNTER — Ambulatory Visit (HOSPITAL_BASED_OUTPATIENT_CLINIC_OR_DEPARTMENT_OTHER)
Admission: RE | Admit: 2023-07-17 | Discharge: 2023-07-17 | Disposition: A | Discharge status: Home / Self Care | Payer: Medicare Other | Source: Ambulatory Visit | Attending: Cardiovascular Disease | Admitting: Cardiovascular Disease

## 2023-07-17 ENCOUNTER — Ambulatory Visit (HOSPITAL_COMMUNITY)
Admission: RE | Admit: 2023-07-17 | Discharge: 2023-07-17 | Disposition: A | Discharge status: Home / Self Care | Payer: Medicare Other | Source: Ambulatory Visit | Attending: Cardiovascular Disease | Admitting: Cardiovascular Disease

## 2023-07-17 DIAGNOSIS — I2510 Atherosclerotic heart disease of native coronary artery without angina pectoris: Secondary | ICD-10-CM | POA: Diagnosis not present

## 2023-07-17 DIAGNOSIS — R0789 Other chest pain: Secondary | ICD-10-CM

## 2023-07-17 DIAGNOSIS — G4733 Obstructive sleep apnea (adult) (pediatric): Secondary | ICD-10-CM | POA: Insufficient documentation

## 2023-07-17 DIAGNOSIS — R931 Abnormal findings on diagnostic imaging of heart and coronary circulation: Secondary | ICD-10-CM

## 2023-07-17 DIAGNOSIS — R0602 Shortness of breath: Secondary | ICD-10-CM

## 2023-07-17 MED ORDER — NITROGLYCERIN 0.4 MG SL SUBL
0.8000 mg | SUBLINGUAL_TABLET | Freq: Once | SUBLINGUAL | Status: AC
  Administered 2023-07-17: 0.8 mg via SUBLINGUAL

## 2023-07-17 MED ORDER — IOHEXOL 350 MG/ML SOLN
95.0000 mL | Freq: Once | INTRAVENOUS | Status: AC | PRN
  Administered 2023-07-17: 95 mL via INTRAVENOUS

## 2023-07-17 MED ORDER — NITROGLYCERIN 0.4 MG SL SUBL
SUBLINGUAL_TABLET | SUBLINGUAL | Status: AC
  Filled 2023-07-17: qty 2

## 2023-07-21 DIAGNOSIS — G4733 Obstructive sleep apnea (adult) (pediatric): Secondary | ICD-10-CM | POA: Diagnosis not present

## 2023-07-23 DIAGNOSIS — G4733 Obstructive sleep apnea (adult) (pediatric): Secondary | ICD-10-CM | POA: Diagnosis not present

## 2023-08-01 ENCOUNTER — Other Ambulatory Visit (HOSPITAL_COMMUNITY)
Admission: RE | Admit: 2023-08-01 | Discharge: 2023-08-01 | Disposition: A | Discharge status: Home / Self Care | Payer: Self-pay | Source: Ambulatory Visit | Attending: Oncology | Admitting: Oncology

## 2023-08-08 LAB — GENECONNECT MOLECULAR SCREEN: Genetic Analysis Overall Interpretation: NEGATIVE

## 2023-08-27 DIAGNOSIS — R948 Abnormal results of function studies of other organs and systems: Secondary | ICD-10-CM | POA: Diagnosis not present

## 2023-08-27 DIAGNOSIS — E291 Testicular hypofunction: Secondary | ICD-10-CM | POA: Diagnosis not present

## 2023-09-04 DIAGNOSIS — Z87442 Personal history of urinary calculi: Secondary | ICD-10-CM | POA: Diagnosis not present

## 2023-09-04 DIAGNOSIS — E291 Testicular hypofunction: Secondary | ICD-10-CM | POA: Diagnosis not present

## 2023-09-04 DIAGNOSIS — R972 Elevated prostate specific antigen [PSA]: Secondary | ICD-10-CM | POA: Diagnosis not present

## 2023-09-08 DIAGNOSIS — G4733 Obstructive sleep apnea (adult) (pediatric): Secondary | ICD-10-CM | POA: Diagnosis not present

## 2023-09-13 DIAGNOSIS — G4733 Obstructive sleep apnea (adult) (pediatric): Secondary | ICD-10-CM | POA: Diagnosis not present

## 2023-09-17 ENCOUNTER — Other Ambulatory Visit (HOSPITAL_BASED_OUTPATIENT_CLINIC_OR_DEPARTMENT_OTHER): Payer: Self-pay

## 2023-09-17 DIAGNOSIS — Z23 Encounter for immunization: Secondary | ICD-10-CM | POA: Diagnosis not present

## 2023-09-17 DIAGNOSIS — E1169 Type 2 diabetes mellitus with other specified complication: Secondary | ICD-10-CM | POA: Diagnosis not present

## 2023-09-17 DIAGNOSIS — Z79899 Other long term (current) drug therapy: Secondary | ICD-10-CM | POA: Diagnosis not present

## 2023-09-17 DIAGNOSIS — M170 Bilateral primary osteoarthritis of knee: Secondary | ICD-10-CM | POA: Diagnosis not present

## 2023-09-17 DIAGNOSIS — E7800 Pure hypercholesterolemia, unspecified: Secondary | ICD-10-CM | POA: Diagnosis not present

## 2023-09-17 DIAGNOSIS — Z0000 Encounter for general adult medical examination without abnormal findings: Secondary | ICD-10-CM | POA: Diagnosis not present

## 2023-09-17 DIAGNOSIS — G4733 Obstructive sleep apnea (adult) (pediatric): Secondary | ICD-10-CM | POA: Diagnosis not present

## 2023-09-17 MED ORDER — CAPVAXIVE 0.5 ML IM SOSY
0.5000 mL | PREFILLED_SYRINGE | Freq: Once | INTRAMUSCULAR | 0 refills | Status: AC
  Filled 2023-09-17: qty 0.5, 1d supply, fill #0

## 2023-10-02 DIAGNOSIS — G4733 Obstructive sleep apnea (adult) (pediatric): Secondary | ICD-10-CM | POA: Diagnosis not present

## 2023-10-02 DIAGNOSIS — G4761 Periodic limb movement disorder: Secondary | ICD-10-CM | POA: Diagnosis not present

## 2023-11-10 DIAGNOSIS — J069 Acute upper respiratory infection, unspecified: Secondary | ICD-10-CM | POA: Diagnosis not present

## 2023-11-18 DIAGNOSIS — L821 Other seborrheic keratosis: Secondary | ICD-10-CM | POA: Diagnosis not present

## 2023-11-18 DIAGNOSIS — L578 Other skin changes due to chronic exposure to nonionizing radiation: Secondary | ICD-10-CM | POA: Diagnosis not present

## 2023-11-18 DIAGNOSIS — D229 Melanocytic nevi, unspecified: Secondary | ICD-10-CM | POA: Diagnosis not present

## 2023-11-18 DIAGNOSIS — L570 Actinic keratosis: Secondary | ICD-10-CM | POA: Diagnosis not present

## 2023-11-18 DIAGNOSIS — L814 Other melanin hyperpigmentation: Secondary | ICD-10-CM | POA: Diagnosis not present

## 2023-11-20 DIAGNOSIS — E7800 Pure hypercholesterolemia, unspecified: Secondary | ICD-10-CM | POA: Diagnosis not present

## 2023-11-20 DIAGNOSIS — Z1331 Encounter for screening for depression: Secondary | ICD-10-CM | POA: Diagnosis not present

## 2023-11-20 DIAGNOSIS — E119 Type 2 diabetes mellitus without complications: Secondary | ICD-10-CM | POA: Diagnosis not present

## 2023-11-20 DIAGNOSIS — G4733 Obstructive sleep apnea (adult) (pediatric): Secondary | ICD-10-CM | POA: Diagnosis not present

## 2023-11-20 DIAGNOSIS — E291 Testicular hypofunction: Secondary | ICD-10-CM | POA: Diagnosis not present

## 2023-11-24 DIAGNOSIS — J209 Acute bronchitis, unspecified: Secondary | ICD-10-CM | POA: Diagnosis not present

## 2023-11-25 DIAGNOSIS — M4316 Spondylolisthesis, lumbar region: Secondary | ICD-10-CM | POA: Diagnosis not present

## 2023-12-02 DIAGNOSIS — G4761 Periodic limb movement disorder: Secondary | ICD-10-CM | POA: Diagnosis not present

## 2023-12-02 DIAGNOSIS — Z8673 Personal history of transient ischemic attack (TIA), and cerebral infarction without residual deficits: Secondary | ICD-10-CM | POA: Diagnosis not present

## 2023-12-02 DIAGNOSIS — E291 Testicular hypofunction: Secondary | ICD-10-CM | POA: Diagnosis not present

## 2023-12-02 DIAGNOSIS — G4733 Obstructive sleep apnea (adult) (pediatric): Secondary | ICD-10-CM | POA: Diagnosis not present

## 2023-12-02 DIAGNOSIS — R063 Periodic breathing: Secondary | ICD-10-CM | POA: Diagnosis not present

## 2023-12-04 DIAGNOSIS — E7800 Pure hypercholesterolemia, unspecified: Secondary | ICD-10-CM | POA: Diagnosis not present

## 2023-12-04 DIAGNOSIS — G4733 Obstructive sleep apnea (adult) (pediatric): Secondary | ICD-10-CM | POA: Diagnosis not present

## 2023-12-04 DIAGNOSIS — Z6841 Body Mass Index (BMI) 40.0 and over, adult: Secondary | ICD-10-CM | POA: Diagnosis not present

## 2023-12-04 DIAGNOSIS — E119 Type 2 diabetes mellitus without complications: Secondary | ICD-10-CM | POA: Diagnosis not present

## 2023-12-04 DIAGNOSIS — Z79899 Other long term (current) drug therapy: Secondary | ICD-10-CM | POA: Diagnosis not present

## 2023-12-04 DIAGNOSIS — E66813 Obesity, class 3: Secondary | ICD-10-CM | POA: Diagnosis not present

## 2023-12-04 DIAGNOSIS — E291 Testicular hypofunction: Secondary | ICD-10-CM | POA: Diagnosis not present

## 2023-12-16 DIAGNOSIS — F17210 Nicotine dependence, cigarettes, uncomplicated: Secondary | ICD-10-CM | POA: Diagnosis not present

## 2023-12-18 DIAGNOSIS — E119 Type 2 diabetes mellitus without complications: Secondary | ICD-10-CM | POA: Diagnosis not present

## 2023-12-18 DIAGNOSIS — R7989 Other specified abnormal findings of blood chemistry: Secondary | ICD-10-CM | POA: Diagnosis not present

## 2023-12-18 DIAGNOSIS — G4733 Obstructive sleep apnea (adult) (pediatric): Secondary | ICD-10-CM | POA: Diagnosis not present

## 2023-12-18 DIAGNOSIS — E7800 Pure hypercholesterolemia, unspecified: Secondary | ICD-10-CM | POA: Diagnosis not present

## 2024-01-08 DIAGNOSIS — Z8673 Personal history of transient ischemic attack (TIA), and cerebral infarction without residual deficits: Secondary | ICD-10-CM | POA: Diagnosis not present

## 2024-01-08 DIAGNOSIS — G4733 Obstructive sleep apnea (adult) (pediatric): Secondary | ICD-10-CM | POA: Diagnosis not present

## 2024-01-08 DIAGNOSIS — E7800 Pure hypercholesterolemia, unspecified: Secondary | ICD-10-CM | POA: Diagnosis not present

## 2024-01-08 DIAGNOSIS — E119 Type 2 diabetes mellitus without complications: Secondary | ICD-10-CM | POA: Diagnosis not present

## 2024-01-23 DIAGNOSIS — K080 Exfoliation of teeth due to systemic causes: Secondary | ICD-10-CM | POA: Diagnosis not present

## 2024-01-26 DIAGNOSIS — K080 Exfoliation of teeth due to systemic causes: Secondary | ICD-10-CM | POA: Diagnosis not present

## 2024-01-27 ENCOUNTER — Telehealth: Payer: Self-pay | Admitting: *Deleted

## 2024-01-27 NOTE — Telephone Encounter (Signed)
 yes please, at this time he does not have an indication for SBE but things can always change.

## 2024-01-27 NOTE — Telephone Encounter (Signed)
 I s/w [place] at [facility] office and she tells me they just wanted to know if pt needed SBE, they did cleaning yesterday. His next cleaning is 07/2024. I did d/w preop APP Sherill Mose, NP who states: for the first 6 months post closure they do but per Izetta Rothman note in 02/2023 he no longer needs SBE   I tried to call their office back but was disconnected. I will fax these notes to [facility].   The [facility] will still need to send  a clearance request closer to 07/2024.

## 2024-02-04 DIAGNOSIS — E7800 Pure hypercholesterolemia, unspecified: Secondary | ICD-10-CM | POA: Diagnosis not present

## 2024-02-04 DIAGNOSIS — G4733 Obstructive sleep apnea (adult) (pediatric): Secondary | ICD-10-CM | POA: Diagnosis not present

## 2024-02-04 DIAGNOSIS — E291 Testicular hypofunction: Secondary | ICD-10-CM | POA: Diagnosis not present

## 2024-02-04 DIAGNOSIS — E119 Type 2 diabetes mellitus without complications: Secondary | ICD-10-CM | POA: Diagnosis not present

## 2024-02-13 DIAGNOSIS — E119 Type 2 diabetes mellitus without complications: Secondary | ICD-10-CM | POA: Diagnosis not present

## 2024-02-20 DIAGNOSIS — G4733 Obstructive sleep apnea (adult) (pediatric): Secondary | ICD-10-CM | POA: Diagnosis not present

## 2024-02-25 DIAGNOSIS — E119 Type 2 diabetes mellitus without complications: Secondary | ICD-10-CM | POA: Diagnosis not present

## 2024-02-25 DIAGNOSIS — G4733 Obstructive sleep apnea (adult) (pediatric): Secondary | ICD-10-CM | POA: Diagnosis not present

## 2024-02-25 DIAGNOSIS — E7800 Pure hypercholesterolemia, unspecified: Secondary | ICD-10-CM | POA: Diagnosis not present

## 2024-02-25 DIAGNOSIS — E291 Testicular hypofunction: Secondary | ICD-10-CM | POA: Diagnosis not present

## 2024-03-01 DIAGNOSIS — M1712 Unilateral primary osteoarthritis, left knee: Secondary | ICD-10-CM | POA: Diagnosis not present

## 2024-03-01 DIAGNOSIS — M67912 Unspecified disorder of synovium and tendon, left shoulder: Secondary | ICD-10-CM | POA: Diagnosis not present

## 2024-03-01 DIAGNOSIS — M25552 Pain in left hip: Secondary | ICD-10-CM | POA: Diagnosis not present

## 2024-03-02 DIAGNOSIS — H5213 Myopia, bilateral: Secondary | ICD-10-CM | POA: Diagnosis not present

## 2024-03-15 DIAGNOSIS — E119 Type 2 diabetes mellitus without complications: Secondary | ICD-10-CM | POA: Diagnosis not present

## 2024-03-16 DIAGNOSIS — Z79899 Other long term (current) drug therapy: Secondary | ICD-10-CM | POA: Diagnosis not present

## 2024-03-16 DIAGNOSIS — G4733 Obstructive sleep apnea (adult) (pediatric): Secondary | ICD-10-CM | POA: Diagnosis not present

## 2024-03-16 DIAGNOSIS — E66813 Obesity, class 3: Secondary | ICD-10-CM | POA: Diagnosis not present

## 2024-03-16 DIAGNOSIS — E119 Type 2 diabetes mellitus without complications: Secondary | ICD-10-CM | POA: Diagnosis not present

## 2024-04-06 DIAGNOSIS — M1712 Unilateral primary osteoarthritis, left knee: Secondary | ICD-10-CM | POA: Diagnosis not present

## 2024-04-14 DIAGNOSIS — E119 Type 2 diabetes mellitus without complications: Secondary | ICD-10-CM | POA: Diagnosis not present

## 2024-04-19 ENCOUNTER — Other Ambulatory Visit: Payer: Self-pay | Admitting: Internal Medicine

## 2024-05-15 DIAGNOSIS — E119 Type 2 diabetes mellitus without complications: Secondary | ICD-10-CM | POA: Diagnosis not present

## 2024-05-20 DIAGNOSIS — L7211 Pilar cyst: Secondary | ICD-10-CM | POA: Diagnosis not present

## 2024-05-25 DIAGNOSIS — M4316 Spondylolisthesis, lumbar region: Secondary | ICD-10-CM | POA: Diagnosis not present

## 2024-05-25 DIAGNOSIS — J320 Chronic maxillary sinusitis: Secondary | ICD-10-CM | POA: Diagnosis not present

## 2024-05-25 DIAGNOSIS — M5412 Radiculopathy, cervical region: Secondary | ICD-10-CM | POA: Diagnosis not present

## 2024-05-25 DIAGNOSIS — M5416 Radiculopathy, lumbar region: Secondary | ICD-10-CM | POA: Diagnosis not present

## 2024-05-26 ENCOUNTER — Encounter (HOSPITAL_COMMUNITY): Payer: Self-pay | Admitting: Internal Medicine

## 2024-06-01 ENCOUNTER — Encounter (HOSPITAL_COMMUNITY): Payer: Self-pay | Admitting: Internal Medicine

## 2024-06-01 ENCOUNTER — Ambulatory Visit (HOSPITAL_COMMUNITY): Admitting: Anesthesiology

## 2024-06-01 ENCOUNTER — Encounter (HOSPITAL_COMMUNITY)
Admission: RE | Disposition: A | Discharge status: Home / Self Care | Payer: Self-pay | Source: Home / Self Care | Attending: Internal Medicine

## 2024-06-01 ENCOUNTER — Ambulatory Visit (HOSPITAL_COMMUNITY)
Admission: RE | Admit: 2024-06-01 | Discharge: 2024-06-01 | Disposition: A | Discharge status: Home / Self Care | Attending: Internal Medicine | Admitting: Internal Medicine

## 2024-06-01 ENCOUNTER — Other Ambulatory Visit: Payer: Self-pay

## 2024-06-01 DIAGNOSIS — Q438 Other specified congenital malformations of intestine: Secondary | ICD-10-CM | POA: Diagnosis not present

## 2024-06-01 DIAGNOSIS — Z1211 Encounter for screening for malignant neoplasm of colon: Secondary | ICD-10-CM | POA: Insufficient documentation

## 2024-06-01 DIAGNOSIS — F418 Other specified anxiety disorders: Secondary | ICD-10-CM | POA: Diagnosis not present

## 2024-06-01 DIAGNOSIS — Z87891 Personal history of nicotine dependence: Secondary | ICD-10-CM | POA: Insufficient documentation

## 2024-06-01 DIAGNOSIS — E66813 Obesity, class 3: Secondary | ICD-10-CM | POA: Diagnosis not present

## 2024-06-01 DIAGNOSIS — Z6841 Body Mass Index (BMI) 40.0 and over, adult: Secondary | ICD-10-CM | POA: Insufficient documentation

## 2024-06-01 DIAGNOSIS — G4730 Sleep apnea, unspecified: Secondary | ICD-10-CM | POA: Insufficient documentation

## 2024-06-01 DIAGNOSIS — Z5309 Procedure and treatment not carried out because of other contraindication: Secondary | ICD-10-CM | POA: Insufficient documentation

## 2024-06-01 DIAGNOSIS — K5730 Diverticulosis of large intestine without perforation or abscess without bleeding: Secondary | ICD-10-CM | POA: Insufficient documentation

## 2024-06-01 HISTORY — PX: COLONOSCOPY: SHX5424

## 2024-06-01 MED ORDER — SODIUM CHLORIDE 0.9 % IV SOLN
INTRAVENOUS | Status: DC
Start: 2024-06-01 — End: 2024-06-01

## 2024-06-01 MED ORDER — PROPOFOL 500 MG/50ML IV EMUL
INTRAVENOUS | Status: DC | PRN
  Administered 2024-06-01: 125 ug/kg/min via INTRAVENOUS

## 2024-06-01 MED ORDER — PROPOFOL 10 MG/ML IV BOLUS
INTRAVENOUS | Status: DC | PRN
  Administered 2024-06-01: 60 mg via INTRAVENOUS

## 2024-06-01 NOTE — Anesthesia Preprocedure Evaluation (Addendum)
 Anesthesia Evaluation  Patient identified by MRN, date of birth, ID band Patient awake    Reviewed: Allergy & Precautions, NPO status , Patient's Chart, lab work & pertinent test results  Airway Mallampati: II  TM Distance: >3 FB Neck ROM: Full    Dental no notable dental hx.    Pulmonary sleep apnea and Continuous Positive Airway Pressure Ventilation , former smoker   Pulmonary exam normal        Cardiovascular Normal cardiovascular exam  S/P percutaneous patent foramen ovale closure   Neuro/Psych  PSYCHIATRIC DISORDERS Anxiety Depression    CVA    GI/Hepatic Neg liver ROS, hiatal hernia,,,  Endo/Other    Class 3 obesity  Renal/GU negative Renal ROS     Musculoskeletal   Abdominal  (+) + obese  Peds  Hematology negative hematology ROS (+)   Anesthesia Other Findings personal history of adenomatous polyps  Reproductive/Obstetrics                              Anesthesia Physical Anesthesia Plan  ASA: 3  Anesthesia Plan: MAC   Post-op Pain Management:    Induction:   PONV Risk Score and Plan: 1 and Propofol  infusion and Treatment may vary due to age or medical condition  Airway Management Planned: Nasal Cannula  Additional Equipment:   Intra-op Plan:   Post-operative Plan:   Informed Consent: I have reviewed the patients History and Physical, chart, labs and discussed the procedure including the risks, benefits and alternatives for the proposed anesthesia with the patient or authorized representative who has indicated his/her understanding and acceptance.     Dental advisory given  Plan Discussed with: CRNA  Anesthesia Plan Comments:          Anesthesia Quick Evaluation

## 2024-06-01 NOTE — Op Note (Signed)
 [facility] Patient Name: Paul Hinton Procedure Date: 06/01/2024 MRN: [id number] Attending MD: Estefana Keas DO, DO, [id number] Date of Birth: 12-06-1955 CSN: [id number] Age: 68 Admit Type: Outpatient Procedure:                Colonoscopy Indications:              Screening for malignant neoplasm in the colon,                            Incomplete colonoscopy to cecum 01/2023 (unable to                            visualize cecum given significant                            looping/tortuosity). Providers:                Estefana Keas DO, DO, Jacquelyn Jaci Pierce,                            RN, Coye Bade, Technician Referring MD:              Medicines:                See the Anesthesia note for documentation of the                            administered medications Complications:            No immediate complications. Estimated Blood Loss:     Estimated blood loss: none. Procedure:                Pre-Anesthesia Assessment:                           - ASA Grade Assessment: III - A patient with severe                            systemic disease.                           - The risks and benefits of the procedure and the                            sedation options and risks were discussed with the                            patient. All questions were answered and informed                            consent was obtained.                           After obtaining informed consent, the colonoscope                            was passed under direct vision. Throughout the  procedure, the patient's blood pressure, pulse, and                            oxygen saturations were monitored continuously. The                            [id number]-[id number] ([id number] Olympus colonscope was                            introduced through the anus and advanced to the the                            ascending colon. The colonoscopy was extremely                             difficult due to a redundant colon, significant                            looping, a tortuous colon and the patient's body                            habitus. Successful completion of the procedure was                            aided by using manual pressure, withdrawing and                            reinserting the scope, straightening and shortening                            the scope to obtain bowel loop reduction, using                            scope torsion and receiving assistance from                            additional staff. The patient tolerated the                            procedure well. The quality of the bowel                            preparation was evaluated using the BBPS [facility]                            Bowel Preparation Scale) with scores of: Right                            Colon = 3 (entire mucosa seen well with no residual                            staining, small fragments of stool or opaque  liquid), Transverse Colon = 2 (minor amount of                            residual staining, small fragments of stool and/or                            opaque liquid, but mucosa seen well) and Left Colon                            = 2 (minor amount of residual staining, small                            fragments of stool and/or opaque liquid, but mucosa                            seen well). The total BBPS score equals 7. The                            quality of the bowel preparation was good. The                            rectum was photographed. Scope In: 8:22:54 AM Scope Out: 8:52:34 AM Scope Withdrawal Time: 0 hours 5 minutes 6 seconds  Total Procedure Duration: 0 hours 29 minutes 40 seconds  Findings:      The perianal and digital rectal examinations were normal.      Multiple small-mouthed diverticula were found in the sigmoid colon.      The sigmoid colon and transverse colon revealed significantly excessive        looping. Advancing the scope required using manual pressure, withdrawing       and reinserting the scope and straightening and shortening the scope to       obtain bowel loop reduction. Ability to advance scope to the cecum was       ultimately unsuccessful with extent reached estimated at proximal       ascending colon.      There is no endoscopic evidence of polyps in the rectum, in the sigmoid       colon, in the descending colon and in the transverse colon. Impression:               - There was significant looping of the colon.                           - No specimens collected. Moderate Sedation:      Monitored anesthesia care provided by anesthesia department. Recommendation:           - Discharge patient to home.                           - Resume previous diet.                           - Continue present medications.                           - Repeat colonoscopy in 3  years for screening                            purposes.                           - Return to GI office PRN. Procedure Code(s):        --- Professional ---                           [id number], 53, Colonoscopy, flexible; diagnostic,                            including collection of specimen(s) by brushing or                            washing, when performed (separate procedure) Diagnosis Code(s):        --- Professional ---                           Z12.11, Encounter for screening for malignant                            neoplasm of colon CPT copyright 2022 [employer]. All rights reserved. The codes documented in this report are preliminary and upon coder review may  be revised to meet current compliance requirements. Dr Estefana Keas, DO Estefana Keas DO, DO 06/01/2024 9:13:48 AM Number of Addenda: 0

## 2024-06-01 NOTE — Discharge Instructions (Signed)
 YOU HAD AN ENDOSCOPIC PROCEDURE TODAY: Refer to the procedure report and other information in the discharge instructions given to you for any specific questions about what was found during the examination. If this information does not answer your questions, please call the [facility] office at [phone] to clarify.   YOU SHOULD EXPECT: Some feelings of bloating in the abdomen. Passage of more gas than usual. Walking can help get rid of the air that was put into your GI tract during the procedure and reduce the bloating. If you had a lower endoscopy (such as a colonoscopy or flexible sigmoidoscopy) you may notice spotting of blood in your stool or on the toilet paper. Some abdominal soreness may be present for a day or two, also.  DIET: Your first meal following the procedure should be a light meal and then it is ok to progress to your normal diet. A half-sandwich or bowl of soup is an example of a good first meal. Heavy or fried foods are harder to digest and may make you feel nauseous or bloated. Drink plenty of fluids but you should avoid alcoholic beverages for 24 hours.    ACTIVITY: Your care partner should take you home directly after the procedure. You should plan to take it easy, moving slowly for the rest of the day. You can resume normal activity the day after the procedure however YOU SHOULD NOT DRIVE, use power tools, machinery or perform tasks that involve climbing or major physical exertion for 24 hours (because of the sedation medicines used during the test).   SYMPTOMS TO REPORT IMMEDIATELY: A gastroenterologist can be reached at any hour. Please call [phone]  for any of the following symptoms:  Following lower endoscopy (colonoscopy, flexible sigmoidoscopy) Excessive amounts of blood in the stool  Significant tenderness, worsening of abdominal pains  Swelling of the abdomen that is new, acute  Fever of 100 or higher    FOLLOW UP:  If any biopsies were taken you will be  contacted by phone or by letter within the next 1-3 weeks. Call [phone]  if you have not heard about the biopsies in 3 weeks.  Please also call with any specific questions about appointments or follow up tests.

## 2024-06-01 NOTE — Transfer of Care (Signed)
 Immediate Anesthesia Transfer of Care Note  Patient: Paul Hinton  Procedure(s) Performed: COLONOSCOPY  Patient Location: PACU and Endoscopy Unit  Anesthesia Type:MAC  Level of Consciousness: awake, drowsy, and patient cooperative  Airway & Oxygen Therapy: Patient Spontanous Breathing and Patient connected to nasal cannula oxygen   Post-op Assessment: Report given to RN and Post -op Vital signs reviewed and stable  Post vital signs: Reviewed and stable  Last Vitals:  Vitals Value Taken Time  BP 135/83 06/01/24 09:02  Temp    Pulse 61 06/01/24 09:03  Resp 15 06/01/24 09:03  SpO2 97 % 06/01/24 09:03  Vitals shown include unfiled device data.  Last Pain:  Vitals:   06/01/24 0711  TempSrc: Oral  PainSc: 0-No pain      Patients Stated Pain Goal: 0 (06/01/24 0711)  Complications: No notable events documented.

## 2024-06-01 NOTE — Progress Notes (Signed)
 Patient reports positive at home COVID test 05/10/24. Took Paxlovid. Reports negative at home test 05/25/24. Denies any fever or respiratory symptoms in the past 2 weeks. Notified Dr. Patrisha of this. He states ok to proceed with colonoscopy today as long as patient is asymptomatic with normal vital signs and patient would like to proceed.

## 2024-06-01 NOTE — H&P (Signed)
 [facility] H&P  Subjective: Paul Hinton is a 68 y.o. male who presents for colonoscopy. Colonoscopy for screening 01/23/23 unable to reach cecum. Virtual colonoscopy completed 03/31/23 unremarkable for polyps, tortuous. No current chest pain or shortness of breath. Had COVID recently, no residual effects. Tolerated bowel prep.   Objective: General: Awake and alert, non-toxic in appearance Cardio: Regular rate and rhythm  Pulm: Clear to auscultation, no conversational dyspnea  Assessment:  Colon cancer screening History incomplete colonoscopy Tortuous colon  Plan:  -Recommend colonoscopy, previously discussed risks/benefits/alternatives to procedure, no questions -Further recommendations to follow pending procedure  Estefana Keas, DO [facility] '

## 2024-06-02 ENCOUNTER — Encounter (HOSPITAL_COMMUNITY): Payer: Self-pay | Admitting: Internal Medicine

## 2024-06-02 NOTE — Anesthesia Postprocedure Evaluation (Signed)
 Anesthesia Post Note  Patient: Paul Hinton  Procedure(s) Performed: COLONOSCOPY     Patient location during evaluation: Endoscopy Anesthesia Type: MAC Level of consciousness: awake Pain management: pain level controlled Vital Signs Assessment: post-procedure vital signs reviewed and stable Respiratory status: spontaneous breathing, nonlabored ventilation and respiratory function stable Cardiovascular status: blood pressure returned to baseline and stable Postop Assessment: no apparent nausea or vomiting Anesthetic complications: no   No notable events documented.  Last Vitals:  Vitals:   06/01/24 0910 06/01/24 0918  BP: (!) 141/81 (!) 134/91  Pulse: (!) 59 64  Resp: 15 17  Temp:    SpO2: 99% 98%    Last Pain:  Vitals:   06/01/24 0918  TempSrc:   PainSc: 0-No pain                 Ender Rorke P Eriyana Sweeten

## 2024-06-29 DIAGNOSIS — R063 Periodic breathing: Secondary | ICD-10-CM | POA: Diagnosis not present

## 2024-06-29 DIAGNOSIS — Z8673 Personal history of transient ischemic attack (TIA), and cerebral infarction without residual deficits: Secondary | ICD-10-CM | POA: Diagnosis not present

## 2024-06-29 DIAGNOSIS — G4733 Obstructive sleep apnea (adult) (pediatric): Secondary | ICD-10-CM | POA: Diagnosis not present

## 2024-06-29 DIAGNOSIS — Z6841 Body Mass Index (BMI) 40.0 and over, adult: Secondary | ICD-10-CM | POA: Diagnosis not present
